# Patient Record
Sex: Female | Born: 1990
Health system: Southern US, Community
[De-identification: ages and names within clinical notes are randomized; demographics above are authoritative.]

## PROBLEM LIST (undated history)

## (undated) ENCOUNTER — Emergency Department (HOSPITAL_COMMUNITY): Admission: EM | Payer: Self-pay | Source: Home / Self Care

## (undated) DIAGNOSIS — O009 Unspecified ectopic pregnancy without intrauterine pregnancy: Secondary | ICD-10-CM

## (undated) DIAGNOSIS — H469 Unspecified optic neuritis: Secondary | ICD-10-CM

## (undated) HISTORY — DX: Unspecified optic neuritis: H46.9

## (undated) HISTORY — PX: NO PAST SURGERIES: SHX2092

---
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
## 2009-09-17 ENCOUNTER — Emergency Department (HOSPITAL_COMMUNITY): Admission: EM | Admit: 2009-09-17 | Discharge: 2009-09-17 | Payer: Self-pay | Admitting: Emergency Medicine

## 2010-04-05 ENCOUNTER — Emergency Department (HOSPITAL_COMMUNITY)
Admission: EM | Admit: 2010-04-05 | Discharge: 2010-04-06 | Payer: Self-pay | Source: Home / Self Care | Admitting: Emergency Medicine

## 2010-06-29 LAB — BASIC METABOLIC PANEL
BUN: 16 mg/dL (ref 6–23)
CO2: 26 mEq/L (ref 19–32)
Calcium: 9.3 mg/dL (ref 8.4–10.5)
Chloride: 105 mEq/L (ref 96–112)
Creatinine, Ser: 0.81 mg/dL (ref 0.4–1.2)
GFR calc Af Amer: >60 mL/min (ref >60)
GFR calc non Af Amer: >60 mL/min (ref >60)
Glucose, Bld: 89 mg/dL (ref 70–99)
Potassium: 4 mEq/L (ref 3.5–5.1)
Sodium: 139 mEq/L (ref 135–145)

## 2010-07-06 LAB — POCT PREGNANCY, URINE: Preg Test, Ur: NEGATIVE

## 2010-07-06 LAB — DIFFERENTIAL
Basophils Absolute: 0 10*3/uL (ref 0.0–0.1)
Basophils Relative: 1 % (ref 0–1)
Eosinophils Absolute: 0.1 10*3/uL (ref 0.0–0.7)
Eosinophils Relative: 2 % (ref 0–5)
Lymphocytes Relative: 43 % (ref 12–46)
Lymphs Abs: 1.7 10*3/uL (ref 0.7–4.0)
Monocytes Absolute: 0.3 10*3/uL (ref 0.1–1.0)
Monocytes Relative: 7 % (ref 3–12)
Neutro Abs: 2 10*3/uL (ref 1.7–7.7)
Neutrophils Relative %: 48 % (ref 43–77)

## 2010-07-06 LAB — BASIC METABOLIC PANEL
BUN: 9 mg/dL (ref 6–23)
CO2: 28 mEq/L (ref 19–32)
Calcium: 9.6 mg/dL (ref 8.4–10.5)
Chloride: 109 mEq/L (ref 96–112)
Creatinine, Ser: 0.75 mg/dL (ref 0.4–1.2)
GFR calc Af Amer: >60 mL/min (ref >60)
GFR calc non Af Amer: >60 mL/min (ref >60)
Glucose, Bld: 82 mg/dL (ref 70–99)
Potassium: 4 mEq/L (ref 3.5–5.1)
Sodium: 140 mEq/L (ref 135–145)

## 2010-07-06 LAB — CBC
HCT: 41 % (ref 36.0–46.0)
Hemoglobin: 13.5 g/dL (ref 12.0–15.0)
MCHC: 32.9 g/dL (ref 30.0–36.0)
MCV: 85.5 fL (ref 78.0–100.0)
Platelets: 118 10*3/uL — ABNORMAL LOW (ref 150–400)
RBC: 4.79 MIL/uL (ref 3.87–5.11)
RDW: 12.5 % (ref 11.5–15.5)
WBC: 4.1 10*3/uL (ref 4.0–10.5)

## 2010-08-04 LAB — RAPID STREP SCREEN (MED CTR MEBANE ONLY): Streptococcus, Group A Screen (Direct): POSITIVE — AB

## 2010-09-01 NOTE — H&P (Signed)
Jennifer [name], Jennifer [name]                ACCOUNT NO.:  [id number]   MEDICAL RECORD NO.:  [id number]          PATIENT TYPE:  INP   LOCATION:  LDR                           FACILITY:  [facility]   PHYSICIAN:  Lazaro Arms, M.D.   DATE OF BIRTH:  06/16/1990   DATE OF ADMISSION:  DATE OF DISCHARGE:  LH                              HISTORY & PHYSICAL   CHIEF COMPLAINTS:  Induction of labor, secondary to post dates on  Tuesday, September 19, 2006.   HISTORY OF PRESENT ILLNESS:  Saddie is a 20 year old gravida 1, para 0  with an EDC of Sep 10, 2006, based on ultrasound-assigned [facility], placing  her at 41-1/[redacted] weeks gestation.  She began prenatal care in her first  trimester and has had regular visits since then.  Her prenatal course  has been complicated by several bouts of Chlamydia versus Chlamydia that  just never went away.  She has had two negative proof of cures,  since  then.  Blood pressures have been 110s to 120s over 50s to 80s.  Total  weight gain has been about 30 pounds with appropriate fundal height  growth.   PAST MEDICAL HISTORY:  Noncontributory.   SURGICAL HISTORY:  Positive for T&A.   ALLERGIES:  NO ALLERGIES.   MEDICATIONS:  None.   SOCIAL HISTORY:  She is a Medical sales representative at [employer], lives  with her mom and dad.  Father of the baby is 1 years old.   FAMILY HISTORY:  Positive for hypertension, stroke, CAD and cancer.   PHYSICAL EXAM:  HEENT:  Within normal limits.  HEART:  Regular rate and rhythm.  LUNGS:  Clear.  ABDOMEN:  Soft and nontender.  Estimated fetal weight about 6-1/2  pounds, cervical exam 3-4 cm, 50% effaced, -1 station.  LEGS:  Negative.   IMPRESSION:  Intrauterine pregnancy at 41-1/2 weeks, medical induction  of labor.   PLAN:  Will admit her on Tuesday morning and plan AROM and Pitocin.      Jacklyn Shell, C.N.M.      Lazaro Arms, M.D.  Electronically Signed    FC/MEDQ  D:  09/15/2006  T:  09/15/2006  Job:  [id number]

## 2010-09-01 NOTE — Op Note (Signed)
Jennifer [name], Jennifer [name]                ACCOUNT NO.:  [id number]   MEDICAL RECORD NO.:  [id number]          PATIENT TYPE:  INP   LOCATION:  LDR4                          FACILITY:  [facility]   PHYSICIAN:  Lazaro Arms, M.D.   DATE OF BIRTH:  07/06/1990   DATE OF PROCEDURE:  09/20/2006  DATE OF DISCHARGE:                               OPERATIVE REPORT   INDICATIONS FOR PROCEDURE:  Jennifer [name] is 20 year old African-American  female, gravida 1, para 0 at 58 and a half weeks' gestation who is  admitted for post-dates induction.  She is doing well, had one dose of  IV pain medicine.  She is 5 cm and is requesting an epidural.   PROCEDURE IN DETAIL:  Patient is placed in a sitting position.  Betadine  prep as usual and 1% lidocaine is injected in the L3-L4 interspace.  A  17-gauge Tuohy needle is used, loss resistance technique employed and  epidural space is found without difficulty with one pass.  About 3 mL of  0.125% bupivacaine is given as a test dose.  The epidural catheter was  then fed.  An additional 7 mL was then given.  It is taken down 5 cm in  the epidural space.  After she laid down, I gave her another 10 mL to  dose up the epidural.  The continuous infusion of 0.125% bupivacaine  with 2 mcg/mL of fentanyl was begun at 12 mL an hour.  The patient  tolerated the procedure well.  She is getting good pain relief.  Fetal  heart rate tracing was stable.  Blood pressure was stable.      Lazaro Arms, M.D.  Electronically Signed     LHE/MEDQ  D:  09/20/2006  T:  09/20/2006  Job:  [id number]

## 2010-09-01 NOTE — Group Therapy Note (Signed)
NAMEKEAJAH, KILLOUGH                ACCOUNT NO.:  [id number]   MEDICAL RECORD NO.:  [id number]          PATIENT TYPE:  INP   LOCATION:  A413                          FACILITY:  [facility]   PHYSICIAN:  Lazaro Arms, M.D.   DATE OF BIRTH:  13-Jun-1990   DATE OF PROCEDURE:  09/20/2006  DATE OF DISCHARGE:                                 PROGRESS NOTE   DELIVERY NOTE:  Jennifer [name] was comfortable after her epidural and progressed  nicely through labor.  She was noted to be completely dilated at  approximately 1645.  She pushed effectively and after a little over an  hour she had a spontaneous vaginal delivery of a viable female infant at  58.  The right hand was compound.  It was delivered without difficulty  and then the rest of the body came without difficulty.  Just prior to  delivery, a midline episiotomy was cut.  Weight of the baby is 6 pounds,  15-1/2 ounces.  Apgars are 9 and 9.  Twenty units of Pitocin diluted in  1000 cc of lactated Ringer's was infused rapidly IV.  The placenta  separated spontaneously and delivered via controlled cord traction at  1755.  It was inspected and appears to be intact with a three-vessel  cord.  The vagina was inspected and no extensions of the last of the  episiotomy were noted.  There were 2 bleeders on the left side of the  episiotomy that were tied off using 3-0 Monocryl suture and the rest of  the episiotomy repair was made with a 2-0 Vicryl.  Estimated blood loss  350 cc.  The epidural catheter was removed with the blue tip visualized  as being intact.      Jacklyn Shell, C.N.M.      Lazaro Arms, M.D.  Electronically Signed    FC/MEDQ  D:  09/20/2006  T:  09/21/2006  Job:  [id number]   cc:   Jeoffrey Massed, MD  Fax: [phone]

## 2010-09-04 NOTE — H&P (Signed)
[name], [name]                ACCOUNT NO.:  [id number]   MEDICAL RECORD NO.:  [id number]          PATIENT TYPE:  OIB   LOCATION:  LDR3                          FACILITY:  [facility]   PHYSICIAN:  Tilda Burrow, M.D. DATE OF BIRTH:  [birth year]   DATE OF ADMISSION:  07/11/2006  DATE OF DISCHARGE:  LH                              HISTORY & PHYSICAL   REASON FOR ADMISSION:  Pregnancy at 31 weeks with a yellowish discharge.  The patient thinks it might be a mucus plug.   MEDICAL HISTORY:  Negative.   SURGICAL HISTORY:  Positive for tonsils and adenoids.   FAMILY HISTORY:  Positive for hypertension, stroke, coronary artery  disease, and cancer.   PRENATAL COURSE:  Uneventful with the exception she has had Chlamydia x3  during the course of this pregnancy. She has been repeatedly treated but  reinfected.  Blood type is O positive.  UDS negative.  Rubella immune.  Hepatitis B surface antigen is negative.  HIV is nonreactive.  __rpr___  nonreactive.  GC and Chlamydia were positive.  The patient was treated  and on repeat as positive again.  Sickle cell screen was negative.   PHYSICAL EXAMINATION:  VITAL SIGNS:  Stable.  Fetal heart rate pattern  is stable.  GENERAL:  There are no uterine contractions noted.  PELVIC:  Sterile speculum exam was performed.  Cervix is closed, long,  and thick.   A fetal fibronectin was obtained.  Urine for GC and Chlamydia was  obtained.  There is a copious yellow discharge present. The patient  denies any itching or burning with the discharge.   PLAN:  A repeat GC and Chlamydia, fetal fibronectin.  She is to follow  up in the office Friday or p.r.n.  At that time we will re-evaluate at  that time.      Jennifer [name], Jennifer [name]      Tilda Burrow, M.D.  Electronically Signed    DL/MEDQ  D:  16/01/9603  T:  07/11/2006  Job:  [id number]

## 2011-02-04 LAB — CBC
HCT: 21.9 — ABNORMAL LOW
Hemoglobin: 7.5 — CL
MCHC: 34.3
MCV: 80 — ABNORMAL LOW
Platelets: 119 — ABNORMAL LOW
RBC: 2.73 — ABNORMAL LOW
RDW: 13.7
WBC: 15.9 — ABNORMAL HIGH

## 2011-02-04 LAB — CORD BLOOD GAS (ARTERIAL)
Acid-base deficit: 5.7 — ABNORMAL HIGH
Bicarbonate: 18.7 — ABNORMAL LOW
TCO2: 19.7
pCO2 cord blood (arterial): 35
pH cord blood (arterial): 7.35
pO2 cord blood: 34

## 2011-02-04 LAB — DIFFERENTIAL
Basophils Absolute: 0.1
Basophils Relative: 0
Eosinophils Absolute: 0.1
Eosinophils Relative: 1
Lymphocytes Relative: 11 — ABNORMAL LOW
Lymphs Abs: 1.7
Monocytes Absolute: 2 — ABNORMAL HIGH
Monocytes Relative: 13 — ABNORMAL HIGH
Neutro Abs: 12 — ABNORMAL HIGH
Neutrophils Relative %: 75 — ABNORMAL HIGH

## 2011-02-04 LAB — ABO/RH: ABO/RH(D): B POS

## 2012-08-04 ENCOUNTER — Emergency Department (HOSPITAL_COMMUNITY): Payer: Medicaid Other

## 2012-08-04 ENCOUNTER — Inpatient Hospital Stay (HOSPITAL_COMMUNITY)
Admission: AD | Admit: 2012-08-04 | Payer: Medicaid Other | Source: Ambulatory Visit | Admitting: Obstetrics and Gynecology

## 2012-08-04 ENCOUNTER — Encounter (HOSPITAL_COMMUNITY): Payer: Self-pay | Admitting: *Deleted

## 2012-08-04 ENCOUNTER — Emergency Department (HOSPITAL_COMMUNITY)
Admission: EM | Admit: 2012-08-04 | Discharge: 2012-08-04 | Disposition: A | Discharge status: Other Acute Inpatient Hospital | Payer: Medicaid Other | Attending: Emergency Medicine | Admitting: Emergency Medicine

## 2012-08-04 DIAGNOSIS — O99330 Smoking (tobacco) complicating pregnancy, unspecified trimester: Secondary | ICD-10-CM | POA: Insufficient documentation

## 2012-08-04 DIAGNOSIS — O208 Other hemorrhage in early pregnancy: Secondary | ICD-10-CM

## 2012-08-04 DIAGNOSIS — R109 Unspecified abdominal pain: Secondary | ICD-10-CM | POA: Insufficient documentation

## 2012-08-04 DIAGNOSIS — O26899 Other specified pregnancy related conditions, unspecified trimester: Secondary | ICD-10-CM

## 2012-08-04 DIAGNOSIS — O9989 Other specified diseases and conditions complicating pregnancy, childbirth and the puerperium: Secondary | ICD-10-CM | POA: Insufficient documentation

## 2012-08-04 DIAGNOSIS — O209 Hemorrhage in early pregnancy, unspecified: Secondary | ICD-10-CM

## 2012-08-04 DIAGNOSIS — N949 Unspecified condition associated with female genital organs and menstrual cycle: Secondary | ICD-10-CM | POA: Insufficient documentation

## 2012-08-04 HISTORY — DX: Unspecified ectopic pregnancy without intrauterine pregnancy: O00.90

## 2012-08-04 LAB — CREATININE, SERUM
Creatinine, Ser: 0.66 mg/dL (ref 0.50–1.10)
GFR calc Af Amer: >90 mL/min (ref >90)
GFR calc non Af Amer: >90 mL/min (ref >90)

## 2012-08-04 LAB — URINALYSIS, ROUTINE W REFLEX MICROSCOPIC
Bilirubin Urine: NEGATIVE
Glucose, UA: NEGATIVE mg/dL
Hgb urine dipstick: NEGATIVE
Ketones, ur: NEGATIVE mg/dL
Leukocytes, UA: NEGATIVE
Nitrite: NEGATIVE
Protein, ur: NEGATIVE mg/dL
Specific Gravity, Urine: >1.03 — ABNORMAL HIGH (ref 1.005–1.030)
Urobilinogen, UA: 0.2 mg/dL (ref 0.0–1.0)
pH: 6 (ref 5.0–8.0)

## 2012-08-04 LAB — CBC
HCT: 38.4 % (ref 36.0–46.0)
Hemoglobin: 12.9 g/dL (ref 12.0–15.0)
MCH: 27.8 pg (ref 26.0–34.0)
MCHC: 33.6 g/dL (ref 30.0–36.0)
MCV: 82.8 fL (ref 78.0–100.0)
Platelets: 143 10*3/uL — ABNORMAL LOW (ref 150–400)
RBC: 4.64 MIL/uL (ref 3.87–5.11)
RDW: 13.6 % (ref 11.5–15.5)
WBC: 5.7 10*3/uL (ref 4.0–10.5)

## 2012-08-04 LAB — BUN: BUN: 8 mg/dL (ref 6–23)

## 2012-08-04 LAB — WET PREP, GENITAL
Trich, Wet Prep: NONE SEEN
WBC, Wet Prep HPF POC: NONE SEEN
Yeast Wet Prep HPF POC: NONE SEEN

## 2012-08-04 LAB — ABO/RH: ABO/RH(D): B POS

## 2012-08-04 LAB — AST: AST: 13 U/L (ref 0–37)

## 2012-08-04 LAB — HCG, QUANTITATIVE, PREGNANCY: hCG, Beta Chain, Quant, S: 1418 m[IU]/mL — ABNORMAL HIGH (ref <5)

## 2012-08-04 MED ORDER — METHOTREXATE INJECTION FOR WOMEN'S HOSPITAL
50.0000 mg/m2 | Freq: Once | INTRAMUSCULAR | Status: AC
  Administered 2012-08-04: 75 mg via INTRAMUSCULAR
  Filled 2012-08-04: qty 1.5

## 2012-08-04 NOTE — MAU Note (Signed)
Pt transferred from [facility] with dx'd ectopic.  Pt went to [facility] because of bleeding, also had abd pain that had been an ongoing issue.

## 2012-08-04 NOTE — ED Notes (Signed)
Ultrasound department is staffed and aware of pending patient ultrasound. Delay explained to MD and to patient.

## 2012-08-04 NOTE — MAU Note (Signed)
Ectopic pregnancy/MTX handout given, and discussed

## 2012-08-04 NOTE — MAU Provider Note (Signed)
History     CSN: [id number]  Arrival date and time: 08/04/12 1191   None     Chief Complaint  Patient presents with  . Pelvic Pain  . Vaginal Bleeding   HPI 22 y.o. G2P1000 at [redacted]w[redacted]d by LMP sent from [facility] ED where she was evaluated for low abd pain and vaginal bleeding this morning. Quant HCG was 1400 and u/s showed 1.9 x 1.9 x 3.6 cm mass in left adnexa highly suspicious but not yet definitive for ectopic pregnancy. This is a desired pregnancy. Pt states she is having very little pain at this time, but rated her pain at a 6 this morning. Bleeding continues.   Past Medical History  Diagnosis Date  . Ectopic pregnancy     MTX 07/2012    Past Surgical History  Procedure Laterality Date  . No past surgeries      History reviewed. No pertinent family history.  History  Substance Use Topics  . Smoking status: Current Every Day Smoker -- 0.50 packs/day    Types: Cigarettes  . Smokeless tobacco: Never Used  . Alcohol Use: No    Allergies: No Known Allergies  No prescriptions prior to admission    Review of Systems  Constitutional: Negative.   Respiratory: Negative.   Cardiovascular: Negative.   Gastrointestinal: Positive for abdominal pain. Negative for nausea, vomiting, diarrhea and constipation.  Genitourinary: Negative for dysuria, urgency, frequency, hematuria and flank pain.       + vaginal bleeding  Musculoskeletal: Negative.   Neurological: Negative.   Psychiatric/Behavioral: Negative.    Physical Exam   Blood pressure 108/64, pulse 57, temperature 97.2 F (36.2 C), temperature source Oral, resp. rate 20, height 5\' 1"  (1.549 m), weight 117 lb (53.071 kg), last menstrual period 06/22/2012, SpO2 100.00%.  Physical Exam  Nursing note and vitals reviewed. Constitutional: She is oriented to person, place, and time. She appears well-developed and well-nourished. No distress.  Cardiovascular: Normal rate.   Respiratory: Effort normal.  GI: Soft. She  exhibits no distension and no mass. There is tenderness (mild, mid to left low abd). There is no rebound and no guarding.  Musculoskeletal: Normal range of motion.  Neurological: She is alert and oriented to person, place, and time.  Skin: Skin is warm and dry.  Psychiatric: She has a normal mood and affect.    MAU Course  Procedures  Results for orders placed during the hospital encounter of 08/04/12 (from the past 24 hour(s))  CBC     Status: Abnormal   Collection Time    08/04/12  7:40 AM      Result Value Range   WBC 5.7  4.0 - 10.5 K/uL   RBC 4.64  3.87 - 5.11 MIL/uL   Hemoglobin 12.9  12.0 - 15.0 g/dL   HCT 47.8  29.5 - 62.1 %   MCV 82.8  78.0 - 100.0 fL   MCH 27.8  26.0 - 34.0 pg   MCHC 33.6  30.0 - 36.0 g/dL   RDW 30.8  65.7 - 84.6 %   Platelets 143 (*) 150 - 400 K/uL  ABO/RH     Status: None   Collection Time    08/04/12  7:40 AM      Result Value Range   ABO/RH(D) B POS    HCG, QUANTITATIVE, PREGNANCY     Status: Abnormal   Collection Time    08/04/12  7:40 AM      Result Value Range   hCG, Beta  Chain, Quant, S 1418 (*) <5 mIU/mL  URINALYSIS, ROUTINE W REFLEX MICROSCOPIC     Status: Abnormal   Collection Time    08/04/12  7:50 AM      Result Value Range   Color, Urine YELLOW  YELLOW   APPearance CLEAR  CLEAR   Specific Gravity, Urine >1.030 (*) 1.005 - 1.030   pH 6.0  5.0 - 8.0   Glucose, UA NEGATIVE  NEGATIVE mg/dL   Hgb urine dipstick NEGATIVE  NEGATIVE   Bilirubin Urine NEGATIVE  NEGATIVE   Ketones, ur NEGATIVE  NEGATIVE mg/dL   Protein, ur NEGATIVE  NEGATIVE mg/dL   Urobilinogen, UA 0.2  0.0 - 1.0 mg/dL   Nitrite NEGATIVE  NEGATIVE   Leukocytes, UA NEGATIVE  NEGATIVE  WET PREP, GENITAL     Status: Abnormal   Collection Time    08/04/12  8:34 AM      Result Value Range   Yeast Wet Prep HPF POC NONE SEEN  NONE SEEN   Trich, Wet Prep NONE SEEN  NONE SEEN   Clue Cells Wet Prep HPF POC RARE (*) NONE SEEN   WBC, Wet Prep HPF POC NONE SEEN  NONE SEEN    BUN     Status: None   Collection Time    08/04/12  4:40 PM      Result Value Range   BUN 8  6 - 23 mg/dL  CREATININE, SERUM     Status: None   Collection Time    08/04/12  4:40 PM      Result Value Range   Creatinine, Ser 0.66  0.50 - 1.10 mg/dL   GFR calc non Af Amer >90  >90 mL/min   GFR calc Af Amer >90  >90 mL/min  AST     Status: None   Collection Time    08/04/12  4:40 PM      Result Value Range   AST 13  0 - 37 U/L    Assessment and Plan  22 y.o. G2P1000 at [redacted]w[redacted]d with left ectopic pregnancy - will treat with methotrexate per protocol today F/U on day 4 for repeat quant Precautions rev'd  Zhane Donlan 08/04/2012, 5:37 PM

## 2012-08-04 NOTE — ED Notes (Signed)
Patient 2 weeks preganant woke this AM w/severe abdominal cramping.  Went to [facility] noticed dark red blood from vagina.

## 2012-08-04 NOTE — ED Provider Notes (Signed)
History     CSN: [id number]  Arrival date & time 08/04/12  4098   First MD Initiated Contact with Patient 08/04/12 [phone]      Chief Complaint  Patient presents with  . Pelvic Pain  . Vaginal Bleeding    HPI Pt was seen at 0715.   Per pt, c/o gradual onset and persistence of constant vaginal bleeding that began this morning PTA. Describes the bleeding as "more than spotting." Has been associated with lower abd "cramping" pain. Pt hx G2P1, LMP 06/22/2012 with EGA [redacted] weeks and 1/7 days.  Denies dysuria/hematuria, no N/V/D, no back pain, no fevers, no CP/SOB.    OB/GYN: [facility] History reviewed. No pertinent past medical history.  History reviewed. No pertinent past surgical history.    History  Substance Use Topics  . Smoking status: Current Every Day Smoker -- 0.50 packs/day    Types: Cigarettes  . Smokeless tobacco: Not on file  . Alcohol Use: No    OB History   Grav Para Term Preterm Abortions TAB SAB Ect Mult Living   2 1 1              Review of Systems ROS: Statement: All systems negative except as marked or noted in the HPI; Constitutional: Negative for fever and chills. ; ; Eyes: Negative for eye pain, redness and discharge. ; ; ENMT: Negative for ear pain, hoarseness, nasal congestion, sinus pressure and sore throat. ; ; Cardiovascular: Negative for chest pain, palpitations, diaphoresis, dyspnea and peripheral edema. ; ; Respiratory: Negative for cough, wheezing and stridor. ; ; Gastrointestinal: Negative for nausea, vomiting, diarrhea, abdominal pain, blood in stool, hematemesis, jaundice and rectal bleeding. . ; ; Genitourinary: Negative for dysuria, flank pain and hematuria. ; ; GYN: +pelvic pain, +vaginal bleeding. no vaginal discharge, no vulvar pain.;; Musculoskeletal: Negative for back pain and neck pain. Negative for swelling and trauma.; ; Skin: Negative for pruritus, rash, abrasions, blisters, bruising and skin lesion.; ; Neuro: Negative for headache,  lightheadedness and neck stiffness. Negative for weakness, altered level of consciousness , altered mental status, extremity weakness, paresthesias, involuntary movement, seizure and syncope.       Allergies  Review of patient's allergies indicates no known allergies.  Home Medications  No current outpatient prescriptions on file.  BP 108/75  Pulse 71  Temp(Src) 97.9 F (36.6 C) (Oral)  Ht 5\' 1"  (1.549 m)  Wt 117 lb (53.071 kg)  BMI 22.12 kg/m2  SpO2 100%  LMP 06/22/2012  Physical Exam 0720: Physical examination:  Nursing notes reviewed; Vital signs and O2 SAT reviewed;  Constitutional: Well developed, Well nourished, Well hydrated, In no acute distress; Head:  Normocephalic, atraumatic; Eyes: EOMI, PERRL, No scleral icterus; ENMT: Mouth and pharynx normal, Mucous membranes moist; Neck: Supple, Full range of motion, No lymphadenopathy; Cardiovascular: Regular rate and rhythm, No murmur, rub, or gallop; Respiratory: Breath sounds clear & equal bilaterally, No rales, rhonchi, wheezes.  Speaking full sentences with ease, Normal respiratory effort/excursion; Chest: Nontender, Movement normal; Abdomen: Soft, +mild suprapubic tenderness to palp. No rebound or guarding. Nondistended, Normal bowel sounds; Genitourinary: No CVA tenderness. Pelvic exam performed with permission of pt and female ED RN assist during exam.  External genitalia w/o lesions. Vaginal vault without discharge, +dark blood with clots in vault.  Cervix w/o lesions, not friable, small amount of bleeding from closed os.  GC/chlam and wet prep obtained and sent to lab.  Bimanual exam w/o CMT or adnexal tenderness. +mild suprapubic tenderness.;;; Extremities: Pulses normal,  No tenderness, No edema, No calf edema or asymmetry.; Neuro: AA&Ox3, Major CN grossly intact.  Speech clear. No gross focal motor or sensory deficits in extremities.; Skin: Color normal, Warm, Dry.   ED Course  Procedures    MDM  MDM Reviewed: previous  chart, nursing note and vitals Interpretation: labs and ultrasound     Results for orders placed during the hospital encounter of 08/04/12  WET PREP, GENITAL      Result Value Range   Yeast Wet Prep HPF POC NONE SEEN  NONE SEEN   Trich, Wet Prep NONE SEEN  NONE SEEN   Clue Cells Wet Prep HPF POC RARE (*) NONE SEEN   WBC, Wet Prep HPF POC NONE SEEN  NONE SEEN  URINALYSIS, ROUTINE W REFLEX MICROSCOPIC      Result Value Range   Color, Urine YELLOW  YELLOW   APPearance CLEAR  CLEAR   Specific Gravity, Urine >1.030 (*) 1.005 - 1.030   pH 6.0  5.0 - 8.0   Glucose, UA NEGATIVE  NEGATIVE mg/dL   Hgb urine dipstick NEGATIVE  NEGATIVE   Bilirubin Urine NEGATIVE  NEGATIVE   Ketones, ur NEGATIVE  NEGATIVE mg/dL   Protein, ur NEGATIVE  NEGATIVE mg/dL   Urobilinogen, UA 0.2  0.0 - 1.0 mg/dL   Nitrite NEGATIVE  NEGATIVE   Leukocytes, UA NEGATIVE  NEGATIVE  CBC      Result Value Range   WBC 5.7  4.0 - 10.5 K/uL   RBC 4.64  3.87 - 5.11 MIL/uL   Hemoglobin 12.9  12.0 - 15.0 g/dL   HCT 52.8  41.3 - 24.4 %   MCV 82.8  78.0 - 100.0 fL   MCH 27.8  26.0 - 34.0 pg   MCHC 33.6  30.0 - 36.0 g/dL   RDW 01.0  27.2 - 53.6 %   Platelets 143 (*) 150 - 400 K/uL  HCG, QUANTITATIVE, PREGNANCY      Result Value Range   hCG, Beta Chain, Quant, S 1418 (*) <5 mIU/mL  ABO/RH      Result Value Range   ABO/RH(D) B POS      US Ob Comp Less 14 Wks 08/04/2012  *RADIOLOGY REPORT*  Clinical Data: Vaginal bleeding.  Rule out ectopic gestation. bHCG =1418  OBSTETRIC <14 WK [place] AND TRANSVAGINAL OB US  Technique:  Both transabdominal and transvaginal ultrasound examinations were performed for complete evaluation of the gestation as well as the maternal uterus, adnexal regions, and pelvic cul-de-sac.  Transvaginal technique was performed to assess early pregnancy.  Comparison:  None.  Intrauterine gestational sac:  None seen Yolk sac: Not applicable Embryo: Not applicable Cardiac Activity: Not applicable  A thin  endometrial lining is identified with no signs of an intrauterine gestational sac measuring 2.7 mm in width.  Maternal uterus/adnexae: In the left adnexa contiguous with the left ovary is a complex soft tissue density slightly echogenic measuring 1.9 x 1.9 x 3.6 cm and positioned superior and medial to the left ovary.  On cine loop views (especially sagittal views) this appears to potentially be separate from the left ovary but on transverse images a beaked rim of ovarian tissue may be present signifying an ovarian lesion. This demonstrates a small amount of circumferential flow but is avascular centrally.  Normal ovarian stroma is seen measuring 2.4 x 1.8 by 2.2 cm and containing a corpus luteum.  This appearance is suspicious for an ectopic gestation with an exophytic dermoid a secondary consideration. The performing sonographic  felt this was a part of the ovary.  The right ovary has a normal appearance measuring 1.3 by 2.6 x 1.4 cm  A small amount of simple free fluid is noted adjacent to the left ovary and extending into the cul-de-sac.  IMPRESSION: Thin endometrial lining with no evidence for intrauterine gestation.  Left adnexal soft tissue mass contiguous with but likely separate from the left ovary on cine loop views as described above is suspicious for a left ectopic gestation. This cannot be absolutely separated from the left ovary and an exophytic dermoid could have a similar appearance. Correlation with serial bHCG with rescanning as indicated is recommended.  Normal right ovary.  This report was discussed with Dr. Clarene Duke at 11:30 on 08/04/2012.   Original Report Authenticated By: Rhodia Albright, M.D.     1150:  Pt's VS remain stable.  Appears NAD, walking around ED exam room, resps easy, making phone calls. Dx and testing d/w pt.  Questions answered.  Verb understanding, agreeable to transfer to [facility] for further eval.  T/C to OB/GYN Dr. Shawnie Pons, case discussed, including:  HPI, pertinent  PM/SHx, VS/PE, dx testing, ED course and treatment:  Agreeable to accept transfer, requests to send pt to MAU.         Laray Anger, DO 08/05/12 [phone]

## 2012-08-04 NOTE — MAU Note (Signed)
Pt waiting on blood work to be drawn. Sprite given,.

## 2012-08-04 NOTE — ED Notes (Signed)
Pt reports that she is [redacted] weeks pregnant and noticed this morning that she had bright red blood coming from her vagina. Pt states that the blood did not soak through a pad but was more than spotting, "a little bit heavier than a normal period."

## 2012-08-04 NOTE — MAU Provider Note (Signed)
Chart reviewed and agree with management and plan.  

## 2012-08-05 LAB — GC/CHLAMYDIA PROBE AMP
CT Probe RNA: NEGATIVE
GC Probe RNA: NEGATIVE

## 2012-08-06 LAB — URINE CULTURE
Colony Count: NO GROWTH
Culture: NO GROWTH

## 2012-08-07 ENCOUNTER — Encounter (HOSPITAL_COMMUNITY): Payer: Self-pay

## 2012-08-07 ENCOUNTER — Inpatient Hospital Stay (HOSPITAL_COMMUNITY)
Admission: AD | Admit: 2012-08-07 | Discharge: 2012-08-07 | Disposition: A | Discharge status: Home / Self Care | Payer: Medicaid Other | Source: Ambulatory Visit | Attending: Obstetrics and Gynecology | Admitting: Obstetrics and Gynecology

## 2012-08-07 DIAGNOSIS — O009 Unspecified ectopic pregnancy without intrauterine pregnancy: Secondary | ICD-10-CM

## 2012-08-07 DIAGNOSIS — O00109 Unspecified tubal pregnancy without intrauterine pregnancy: Secondary | ICD-10-CM | POA: Insufficient documentation

## 2012-08-07 LAB — HCG, QUANTITATIVE, PREGNANCY: hCG, Beta Chain, Quant, S: 429 m[IU]/mL — ABNORMAL HIGH (ref <5)

## 2012-08-07 NOTE — MAU Note (Signed)
Pt here for f/u post MTX on Friday, states intermittent cramping, spotting, no clots noted. Feels sore.

## 2012-08-07 NOTE — MAU Provider Note (Signed)
Attestation of Attending Supervision of Advanced Practitioner (CNM/NP): Evaluation and management procedures were performed by the Advanced Practitioner under my supervision and collaboration.  I have reviewed the Advanced Practitioner's note and chart, and I agree with the management and plan.  Madalee Altmann 08/07/2012 7:33 PM   

## 2012-08-07 NOTE — MAU Provider Note (Signed)
Ms. Jennifer [name] is a 22 y.o. G2P1010 at [redacted]w[redacted]d who presents to MAU today for follow-up quant hCG on day 4 s/p MTX for ectopic pregnancy. The patient states that she has had some intermittent cramping and spotting. No heavy bleeding. Quant hCG on Friday was 1418.   BP 108/65  Pulse 64  Temp(Src) 98 F (36.7 C) (Oral)  Resp 16  Ht 5\' 1"  (1.549 m)  Wt 119 lb 6 oz (54.148 kg)  BMI 22.57 kg/m2  LMP 06/22/2012  Breastfeeding? Unknown GENERAL: Well-developed, well-nourished female in no acute distress.  HEENT: Normocephalic, atraumatic.   LUNGS: Effort normal HEART: Regular rate  SKIN: Warm, dry and without erythema PSYCH: Normal mood and affect  Results for orders placed during the hospital encounter of 08/07/12 (from the past 24 hour(s))  HCG, QUANTITATIVE, PREGNANCY     Status: Abnormal   Collection Time    08/07/12  9:37 AM      Result Value Range   hCG, Beta Chain, Quant, S 429 (*) <5 mIU/mL   A: Ectopic pregnancy day #4 s/pMTX  P: Discharge home Bleeding precautions discussed Patient to return on Thursday for repeat labs Patient may return to MAU as needed sooner  Freddi Starr, PA-C 08/07/2012 10:28 AM

## 2013-04-25 ENCOUNTER — Encounter (HOSPITAL_COMMUNITY): Payer: Self-pay | Admitting: Emergency Medicine

## 2013-04-25 DIAGNOSIS — W01119A Fall on same level from slipping, tripping and stumbling with subsequent striking against unspecified sharp object, initial encounter: Secondary | ICD-10-CM | POA: Insufficient documentation

## 2013-04-25 DIAGNOSIS — Y9289 Other specified places as the place of occurrence of the external cause: Secondary | ICD-10-CM | POA: Insufficient documentation

## 2013-04-25 DIAGNOSIS — S41109A Unspecified open wound of unspecified upper arm, initial encounter: Secondary | ICD-10-CM | POA: Insufficient documentation

## 2013-04-25 DIAGNOSIS — S61209A Unspecified open wound of unspecified finger without damage to nail, initial encounter: Secondary | ICD-10-CM | POA: Insufficient documentation

## 2013-04-25 DIAGNOSIS — Y9389 Activity, other specified: Secondary | ICD-10-CM | POA: Insufficient documentation

## 2013-04-25 DIAGNOSIS — F17200 Nicotine dependence, unspecified, uncomplicated: Secondary | ICD-10-CM | POA: Insufficient documentation

## 2013-04-25 NOTE — ED Notes (Signed)
States she was at a party, got bumped and fell into a [employer] consultant shattered and she has a lace to left ring finger and left upper arm - bleeding controlled.  Other superficial cuts on arm.  Denies any other injury

## 2013-04-25 NOTE — ED Notes (Signed)
Admits to ETOH tonight

## 2013-04-26 ENCOUNTER — Emergency Department (HOSPITAL_COMMUNITY)
Admission: EM | Admit: 2013-04-26 | Discharge: 2013-04-26 | Disposition: A | Discharge status: Home / Self Care | Payer: Medicaid Other | Attending: Emergency Medicine | Admitting: Emergency Medicine

## 2013-04-26 DIAGNOSIS — S61215A Laceration without foreign body of left ring finger without damage to nail, initial encounter: Secondary | ICD-10-CM

## 2013-04-26 DIAGNOSIS — S41112A Laceration without foreign body of left upper arm, initial encounter: Secondary | ICD-10-CM

## 2013-04-26 MED ORDER — LIDOCAINE HCL (PF) 1 % IJ SOLN
10.0000 mL | Freq: Once | INTRAMUSCULAR | Status: DC
  Filled 2013-04-26: qty 10

## 2013-04-26 NOTE — ED Provider Notes (Signed)
CSN: [id number]     Arrival date & time 04/25/13  2234 History   First MD Initiated Contact with Patient 04/26/13 0051     Chief Complaint  Patient presents with  . Extremity Laceration    left ring finger and left upper arm   (Consider location/radiation/quality/duration/timing/severity/associated sxs/prior Treatment) The history is provided by the patient.   23 year old female fell against a mirror and it broke, causing laceration to her left upper arm and her left fourth finger. She denies foreign body sensation. His tetanus immunization was 8 years ago. She denies other injury. She does admit that she had been drinking earlier in the evening he admits to having had 3 or 4 shots.  Past Medical History  Diagnosis Date  . Ectopic pregnancy     MTX 07/2012   Past Surgical History  Procedure Laterality Date  . No past surgeries     No family history on file. History  Substance Use Topics  . Smoking status: Current Every Day Smoker -- 0.50 packs/day    Types: Cigarettes  . Smokeless tobacco: Never Used  . Alcohol Use: Yes   OB History   Grav Para Term Preterm Abortions TAB SAB Ect Mult Living   2 1 1  1   1        Review of Systems  All other systems reviewed and are negative.    Allergies  Review of patient's allergies indicates no known allergies.  Home Medications  No current outpatient prescriptions on file. BP 98/64  Pulse 96  Temp(Src) 98.7 F (37.1 C) (Oral)  Resp 24  Ht 5' (1.524 m)  Wt 125 lb (56.7 kg)  BMI 24.41 kg/m2  SpO2 100%  LMP 04/10/2013 Physical Exam  Nursing note and vitals reviewed.  23 year old female, resting comfortably and in no acute distress. Vital signs are significant for tachypnea with respiratory rate of 24. Oxygen saturation is 100 %, which is normal. Head is normocephalic and atraumatic. PERRLA, EOMI. Oropharynx is clear. Neck is nontender and supple without adenopathy or JVD. Back is nontender and there is no CVA  tenderness. Lungs are clear without rales, wheezes, or rhonchi. Chest is nontender. Heart has regular rate and rhythm without murmur. Abdomen is soft, flat, nontender without masses or hepatosplenomegaly and peristalsis is normoactive. Extremities have no cyanosis or edema, full range of motion is present. There is a laceration of the lateral aspect of the left upper arm and also a laceration across the flexor surface of the proximal phalanx of the left fourth finger. Distal neurovascular and tendon function is normal. Skin is warm and dry without rash. Neurologic: Mental status is normal, cranial nerves are intact, there are no motor or sensory deficits.  ED Course  Procedures (including critical care time) LACERATION REPAIR Performed by: WUJWJ,XBJYNGLICK,Trinita Devlin Authorized by: WGNFA,OZHYQGLICK,Sherly Brodbeck Consent: Verbal consent obtained. Risks and benefits: risks, benefits and alternatives were discussed Consent given by: patient Patient identity confirmed: provided demographic data Prepped and Draped in normal sterile fashion Wound explored  Laceration Location: left fourth finger   Laceration Length: 3.0 cm  No Foreign Bodies seen or palpated  Anesthesia: local infiltration  Local anesthetic: lidocaine 1% without epinephrine  Anesthetic total: 2.5 ml  Amount of cleaning: standard  Skin closure: close  Number of sutures: 6 5-0 nylon  Technique: simple interrupted, but with alignment of flap  Patient tolerance: Patient tolerated the procedure well with no immediate complications.  LACERATION REPAIR Performed by: MVHQI,ONGEXGLICK,Hila Bolding Authorized by: BMWUX,LKGMWGLICK,Anniah  Consent: Verbal consent  obtained. Risks and benefits: risks, benefits and alternatives were discussed Consent given by: patient Patient identity confirmed: provided demographic data Prepped and Draped in normal sterile fashion Wound explored  Laceration Location: left upper arm  Laceration Length: 2.0 cm  No Foreign Bodies seen or  palpated  Anesthesia: local infiltration  Local anesthetic: lidocaine 1% without epinephrine  Anesthetic total: 2.5 ml  Irrigation method: syringe Amount of cleaning: standard  Skin closure: close  Number of sutures: 4 4-0 nylon  Technique: simple interrupted   Patient tolerance: Patient tolerated the procedure well with no immediate complications.  MDM   1. Laceration of left upper arm without foreign body, initial encounter   2. Laceration of fourth finger, left, initial encounter    Lacerations of left arm and left fourth finger which will require suture repair.    Dione Booze, MD 04/26/13 [phone]

## 2013-04-26 NOTE — Discharge Instructions (Signed)
Stitches need to be removed in 7 days. This can be done at a physician's office, an urgent care center, or in the emergency department.  Laceration Care, Adult A laceration is a cut or lesion that goes through all layers of the skin and into the tissue just beneath the skin. TREATMENT  Some lacerations may not require closure. Some lacerations may not be able to be closed due to an increased risk of infection. It is important to see your caregiver as soon as possible after an injury to minimize the risk of infection and maximize the opportunity for successful closure. If closure is appropriate, pain medicines may be given, if needed. The wound will be cleaned to help prevent infection. Your caregiver will use stitches (sutures), staples, wound glue (adhesive), or skin adhesive strips to repair the laceration. These tools bring the skin edges together to allow for faster healing and a better cosmetic outcome. However, all wounds will heal with a scar. Once the wound has healed, scarring can be minimized by covering the wound with sunscreen during the day for 1 full year. HOME CARE INSTRUCTIONS  For sutures or staples:  Keep the wound clean and dry.  If you were given a bandage (dressing), you should change it at least once a day. Also, change the dressing if it becomes wet or dirty, or as directed by your caregiver.  Wash the wound with soap and water 2 times a day. Rinse the wound off with water to remove all soap. Pat the wound dry with a clean towel.  After cleaning, apply a thin layer of the antibiotic ointment as recommended by your caregiver. This will help prevent infection and keep the dressing from sticking.  You may shower as usual after the first 24 hours. Do not soak the wound in water until the sutures are removed.  Only take over-the-counter or prescription medicines for pain, discomfort, or fever as directed by your caregiver.  Get your sutures or staples removed as directed by  your caregiver. For skin adhesive strips:  Keep the wound clean and dry.  Do not get the skin adhesive strips wet. You may bathe carefully, using caution to keep the wound dry.  If the wound gets wet, pat it dry with a clean towel.  Skin adhesive strips will fall off on their own. You may trim the strips as the wound heals. Do not remove skin adhesive strips that are still stuck to the wound. They will fall off in time. For wound adhesive:  You may briefly wet your wound in the shower or bath. Do not soak or scrub the wound. Do not swim. Avoid periods of heavy perspiration until the skin adhesive has fallen off on its own. After showering or bathing, gently pat the wound dry with a clean towel.  Do not apply liquid medicine, cream medicine, or ointment medicine to your wound while the skin adhesive is in place. This may loosen the film before your wound is healed.  If a dressing is placed over the wound, be careful not to apply tape directly over the skin adhesive. This may cause the adhesive to be pulled off before the wound is healed.  Avoid prolonged exposure to sunlight or tanning lamps while the skin adhesive is in place. Exposure to ultraviolet light in the first year will darken the scar.  The skin adhesive will usually remain in place for 5 to 10 days, then naturally fall off the skin. Do not pick at the adhesive  film. You may need a tetanus shot if:  You cannot remember when you had your last tetanus shot.  You have never had a tetanus shot. If you get a tetanus shot, your arm may swell, get red, and feel warm to the touch. This is common and not a problem. If you need a tetanus shot and you choose not to have one, there is a rare chance of getting tetanus. Sickness from tetanus can be serious. SEEK MEDICAL CARE IF:   You have redness, swelling, or increasing pain in the wound.  You see a red line that goes away from the wound.  You have yellowish-white fluid (pus) coming  from the wound.  You have a fever.  You notice a bad smell coming from the wound or dressing.  Your wound breaks open before or after sutures have been removed.  You notice something coming out of the wound such as wood or glass.  Your wound is on your hand or foot and you cannot move a finger or toe. SEEK IMMEDIATE MEDICAL CARE IF:   Your pain is not controlled with prescribed medicine.  You have severe swelling around the wound causing pain and numbness or a change in color in your arm, hand, leg, or foot.  Your wound splits open and starts bleeding.  You have worsening numbness, weakness, or loss of function of any joint around or beyond the wound.  You develop painful lumps near the wound or on the skin anywhere on your body. MAKE SURE YOU:   Understand these instructions.  Will watch your condition.  Will get help right away if you are not doing well or get worse. Document Released: 04/05/2005 Document Revised: 06/28/2011 Document Reviewed: 09/29/2010 [facility] Patient Information 2014 [place], [place].

## 2013-09-01 ENCOUNTER — Emergency Department (HOSPITAL_COMMUNITY)
Admission: EM | Admit: 2013-09-01 | Discharge: 2013-09-01 | Disposition: A | Discharge status: Home / Self Care | Payer: BC Managed Care – PPO | Attending: Emergency Medicine | Admitting: Emergency Medicine

## 2013-09-01 ENCOUNTER — Encounter (HOSPITAL_COMMUNITY): Payer: Self-pay | Admitting: Emergency Medicine

## 2013-09-01 DIAGNOSIS — R110 Nausea: Secondary | ICD-10-CM | POA: Insufficient documentation

## 2013-09-01 DIAGNOSIS — N898 Other specified noninflammatory disorders of vagina: Secondary | ICD-10-CM | POA: Insufficient documentation

## 2013-09-01 DIAGNOSIS — M549 Dorsalgia, unspecified: Secondary | ICD-10-CM | POA: Insufficient documentation

## 2013-09-01 DIAGNOSIS — Z3202 Encounter for pregnancy test, result negative: Secondary | ICD-10-CM | POA: Insufficient documentation

## 2013-09-01 DIAGNOSIS — R6883 Chills (without fever): Secondary | ICD-10-CM | POA: Insufficient documentation

## 2013-09-01 DIAGNOSIS — F17200 Nicotine dependence, unspecified, uncomplicated: Secondary | ICD-10-CM | POA: Insufficient documentation

## 2013-09-01 DIAGNOSIS — Z8619 Personal history of other infectious and parasitic diseases: Secondary | ICD-10-CM | POA: Insufficient documentation

## 2013-09-01 DIAGNOSIS — N946 Dysmenorrhea, unspecified: Secondary | ICD-10-CM | POA: Insufficient documentation

## 2013-09-01 LAB — CBC WITH DIFFERENTIAL/PLATELET
Basophils Absolute: 0 10*3/uL (ref 0.0–0.1)
Basophils Relative: 0 % (ref 0–1)
Eosinophils Absolute: 0.1 10*3/uL (ref 0.0–0.7)
Eosinophils Relative: 2 % (ref 0–5)
HCT: 42 % (ref 36.0–46.0)
Hemoglobin: 14.1 g/dL (ref 12.0–15.0)
Lymphocytes Relative: 33 % (ref 12–46)
Lymphs Abs: 1.7 10*3/uL (ref 0.7–4.0)
MCH: 28.7 pg (ref 26.0–34.0)
MCHC: 33.6 g/dL (ref 30.0–36.0)
MCV: 85.5 fL (ref 78.0–100.0)
Monocytes Absolute: 0.4 10*3/uL (ref 0.1–1.0)
Monocytes Relative: 8 % (ref 3–12)
Neutro Abs: 3.1 10*3/uL (ref 1.7–7.7)
Neutrophils Relative %: 57 % (ref 43–77)
Platelets: 156 10*3/uL (ref 150–400)
RBC: 4.91 MIL/uL (ref 3.87–5.11)
RDW: 13.4 % (ref 11.5–15.5)
WBC: 5.4 10*3/uL (ref 4.0–10.5)

## 2013-09-01 LAB — WET PREP, GENITAL
Clue Cells Wet Prep HPF POC: NONE SEEN
Trich, Wet Prep: NONE SEEN
Yeast Wet Prep HPF POC: NONE SEEN

## 2013-09-01 LAB — POC URINE PREG, ED: Preg Test, Ur: NEGATIVE

## 2013-09-01 LAB — HCG, QUANTITATIVE, PREGNANCY: hCG, Beta Chain, Quant, S: <1 m[IU]/mL (ref <5)

## 2013-09-01 MED ORDER — NAPROXEN SODIUM 275 MG PO TABS: 275.0000 mg | ORAL_TABLET | Freq: Two times a day (BID) | ORAL | Status: DC

## 2013-09-01 MED ORDER — KETOROLAC TROMETHAMINE 60 MG/2ML IM SOLN
60.0000 mg | Freq: Once | INTRAMUSCULAR | Status: AC
  Administered 2013-09-01: 60 mg via INTRAMUSCULAR
  Filled 2013-09-01: qty 2

## 2013-09-01 NOTE — ED Provider Notes (Signed)
CSN: [id number]     Arrival date & time 09/01/13  0827 History   First MD Initiated Contact with Patient 09/01/13 [phone]     Chief Complaint  Patient presents with  . Abdominal Pain     (Consider location/radiation/quality/duration/timing/severity/associated sxs/prior Treatment) Patient is a 23 y.o. female presenting with abdominal pain. The history is provided by the patient.  Abdominal Pain Pain location:  LLQ and RLQ Pain quality: cramping   Pain radiates to:  Back Pain severity:  Moderate Onset quality:  Gradual Timing:  Constant Progression:  Worsening Chronicity:  New Relieved by:  Nothing Worsened by:  Nothing tried Ineffective treatments:  None tried Associated symptoms: chills, nausea, vaginal bleeding and vaginal discharge   Associated symptoms: no chest pain, no cough, no dysuria, no fever and no vomiting    Jennifer [name] is a G2P1010, LMP 08/01/2013 22 y.o.  who presents to the ED with lower abdominal pain that radiates to the back.the pain started early this morning. The pain woke her. It is time for her period but the cramping is worse that a period. She did start bleeding this am but the bleeding is very light. She complains of nausea. She does not use birth control. Last sexual intercourse 2 nights ago and was not painful. Current sex partner x 10 months. Hx of ectopic pregnancy one year ago and treated with MTX. Hx of Chlamydia.   Past Medical History  Diagnosis Date  . Ectopic pregnancy     MTX 07/2012   Past Surgical History  Procedure Laterality Date  . No past surgeries     History reviewed. No pertinent family history. History  Substance Use Topics  . Smoking status: Current Every Day Smoker -- 0.50 packs/day for 10 years    Types: Cigarettes  . Smokeless tobacco: Never Used  . Alcohol Use: 4.2 oz/week    4 Cans of beer, 3 Shots of liquor per week   OB History   Grav Para Term Preterm Abortions TAB SAB Ect Mult Living   2 1 1  1   1        Review  of Systems  Constitutional: Positive for chills. Negative for fever.  HENT: Negative.   Eyes: Negative for visual disturbance.  Respiratory: Negative for cough and wheezing.   Cardiovascular: Negative for chest pain and palpitations.  Gastrointestinal: Positive for nausea and abdominal pain. Negative for vomiting.  Genitourinary: Positive for vaginal bleeding and vaginal discharge. Negative for dysuria, urgency and frequency.  Musculoskeletal: Positive for back pain. Negative for myalgias.  Skin: Negative for rash.  Neurological: Negative for seizures, syncope and headaches.  Psychiatric/Behavioral: Negative for confusion. The patient is not nervous/anxious.       Allergies  Review of patient's allergies indicates no known allergies.  Home Medications   Prior to Admission medications   Not on File   BP 105/67  Pulse 56  Temp(Src) 98.2 F (36.8 C) (Oral)  Resp 20  Ht 5' (1.524 m)  Wt 130 lb (58.968 kg)  BMI 25.39 kg/m2  SpO2 100%  LMP 09/01/2013 Physical Exam  Nursing note and vitals reviewed. Constitutional: She is oriented to person, place, and time. She appears well-developed and well-nourished. No distress.  HENT:  Head: Normocephalic.  Eyes: EOM are normal.  Neck: Normal range of motion. Neck supple.  Cardiovascular: Normal rate and regular rhythm.   Pulmonary/Chest: Effort normal and breath sounds normal.  Abdominal: Soft. Bowel sounds are normal. There is tenderness.  Genitourinary:  External  genitalia without lesions. Small amount of blood vaginal vault. Cervix closed, no CMT, mild bilateral adnexal tenderness. Uterus without palpable enlargement.   Musculoskeletal: Normal range of motion.  Neurological: She is alert and oriented to person, place, and time. No cranial nerve deficit.  Skin: Skin is warm and dry.  Psychiatric: She has a normal mood and affect. Her behavior is normal.   Results for orders placed during the hospital encounter of 09/01/13 (from  the past 24 hour(s))  CBC WITH DIFFERENTIAL     Status: None   Collection Time    09/01/13  9:13 AM      Result Value Ref Range   WBC 5.4  4.0 - 10.5 K/uL   RBC 4.91  3.87 - 5.11 MIL/uL   Hemoglobin 14.1  12.0 - 15.0 g/dL   HCT 09.842.0  11.936.0 - 14.746.0 %   MCV 85.5  78.0 - 100.0 fL   MCH 28.7  26.0 - 34.0 pg   MCHC 33.6  30.0 - 36.0 g/dL   RDW 82.913.4  56.211.5 - 13.015.5 %   Platelets 156  150 - 400 K/uL   Neutrophils Relative % 57  43 - 77 %   Neutro Abs 3.1  1.7 - 7.7 K/uL   Lymphocytes Relative 33  12 - 46 %   Lymphs Abs 1.7  0.7 - 4.0 K/uL   Monocytes Relative 8  3 - 12 %   Monocytes Absolute 0.4  0.1 - 1.0 K/uL   Eosinophils Relative 2  0 - 5 %   Eosinophils Absolute 0.1  0.0 - 0.7 K/uL   Basophils Relative 0  0 - 1 %   Basophils Absolute 0.0  0.0 - 0.1 K/uL  HCG, QUANTITATIVE, PREGNANCY     Status: None   Collection Time    09/01/13  9:13 AM      Result Value Ref Range   hCG, Beta Chain, Quant, S <1  <5 mIU/mL  POC URINE PREG, ED     Status: None   Collection Time    09/01/13  9:24 AM      Result Value Ref Range   Preg Test, Ur NEGATIVE  NEGATIVE  WET PREP, GENITAL     Status: Abnormal   Collection Time    09/01/13 10:32 AM      Result Value Ref Range   Yeast Wet Prep HPF POC NONE SEEN  NONE SEEN   Trich, Wet Prep NONE SEEN  NONE SEEN   Clue Cells Wet Prep HPF POC NONE SEEN  NONE SEEN   WBC, Wet Prep HPF POC FEW (*) NONE SEEN    ED Course  Procedures  MDM  23 y.o. female with vaginal bleeding. Stable for discharge with negative Bhcg so no concern for ectopic pregnancy at this time. Exam does not give worry for torsion or TOA. Will treat for dysmenorrhea and patient will follow up with her PCP. She will return here for worsening symptoms. Will treat with NSAIDS.  Discussed with the patient and all questioned fully answered.    Medication List         naproxen sodium 275 MG tablet  Commonly known as:  ANAPROX  Take 1 tablet (275 mg total) by mouth 2 (two) times daily with a  meal.         Janne NapoleonHope M Neese, NP 09/01/13 1702

## 2013-09-01 NOTE — ED Notes (Signed)
Patient c/o lower abd pain that radiates into back. Patient reports vaginal bleeding and nausea. Denies any vomiting, diarrhea, or urinary symptoms.

## 2013-09-01 NOTE — Discharge Instructions (Signed)

## 2013-09-02 NOTE — ED Provider Notes (Signed)
Medical screening examination/treatment/procedure(s) were conducted as a shared visit with non-physician practitioner(s) and myself.  I personally evaluated the patient during the encounter.   EKG Interpretation None     History and physical most consistent with menstrual pain.  No acute abdomen.  Pregnancy test negative  Donnetta Hutching, MD 09/02/13 [phone]

## 2013-09-03 LAB — GC/CHLAMYDIA PROBE AMP
CT Probe RNA: NEGATIVE
GC Probe RNA: NEGATIVE

## 2014-02-18 ENCOUNTER — Encounter (HOSPITAL_COMMUNITY): Payer: Self-pay | Admitting: Emergency Medicine

## 2015-05-12 ENCOUNTER — Encounter: Payer: Self-pay | Admitting: Obstetrics & Gynecology

## 2015-05-12 ENCOUNTER — Ambulatory Visit (INDEPENDENT_AMBULATORY_CARE_PROVIDER_SITE_OTHER): Payer: BLUE CROSS/BLUE SHIELD | Admitting: Obstetrics & Gynecology

## 2015-05-12 VITALS — BP 100/60 | HR 60 | Wt 136.3 lb

## 2015-05-12 DIAGNOSIS — B9689 Other specified bacterial agents as the cause of diseases classified elsewhere: Secondary | ICD-10-CM

## 2015-05-12 DIAGNOSIS — N760 Acute vaginitis: Secondary | ICD-10-CM | POA: Diagnosis not present

## 2015-05-12 DIAGNOSIS — A499 Bacterial infection, unspecified: Secondary | ICD-10-CM | POA: Diagnosis not present

## 2015-05-12 MED ORDER — METRONIDAZOLE 0.75 % VA GEL: VAGINAL | Status: DC

## 2015-05-12 NOTE — Progress Notes (Signed)
Patient ID: Jennifer [name], female   DOB: Sep 02, 1990, 25 y.o.   MRN: [id number]      Chief Complaint  Patient presents with  . new gyn    weird discharge    Blood pressure 100/60, pulse 60, weight 136 lb 4.8 oz (61.825 kg), last menstrual period 04/18/2015.  25 y.o. G2P1010 Patient's last menstrual period was 04/18/2015. The current method of family planning is none.  Subjective Vaginal discharge for a couple of months Itchy Unpleasant odor occasionally  No antibiotics No bleeding Periods are regular  Objective Vulva:  normal appearing vulva with no masses, tenderness or lesions Vagina:  normal mucosa, thin grey discharge Cervix:  no cervical motion tenderness and no lesions Uterus:  normal size, contour, position, consistency, mobility, non-tender Adnexa: ovaries:,      Pertinent ROS No burning with urination, frequency or urgency No nausea, vomiting or diarrhea Nor fever chills or other constitutional symptoms   Labs or studies Wet Prep:   A sample of vaginal discharge was obtained from the posterior fornix using a cotton swab. 2 drops of saline were placed on a slide and the cotton swab was immersed in the saline. Microscopic evaluation was performed and results were as follows:  Negative  for yeast  Positive for clue cells , consistent with Bacterial vaginosis Negative for trichomonas  Normal WBC population   Whiff test: Positive     Impression Diagnoses this Encounter::   ICD-9-CM ICD-10-CM   1. Bacterial vaginosis 616.10 N76.0    041.9 A49.9     Established relevant diagnosis(es):   Plan/Recommendations: Meds ordered this encounter  Medications  . metroNIDAZOLE (METROGEL VAGINAL) 0.75 % vaginal gel    Sig: Nightly x 5 nights    Dispense:  70 g    Refill:  0    Labs or Scans Ordered: No orders of the defined types were placed in this encounter.    Management::   Follow up Need to make a yearly      All questions were  answered.

## 2015-08-28 ENCOUNTER — Ambulatory Visit: Payer: BLUE CROSS/BLUE SHIELD | Admitting: Obstetrics & Gynecology

## 2016-06-11 ENCOUNTER — Ambulatory Visit: Payer: BLUE CROSS/BLUE SHIELD | Admitting: Obstetrics and Gynecology

## 2016-06-16 ENCOUNTER — Ambulatory Visit: Payer: BLUE CROSS/BLUE SHIELD | Admitting: Advanced Practice Midwife

## 2016-06-23 ENCOUNTER — Encounter: Payer: Self-pay | Admitting: Advanced Practice Midwife

## 2016-06-23 ENCOUNTER — Ambulatory Visit (INDEPENDENT_AMBULATORY_CARE_PROVIDER_SITE_OTHER): Payer: Managed Care, Other (non HMO) | Admitting: Advanced Practice Midwife

## 2016-06-23 ENCOUNTER — Ambulatory Visit: Payer: BLUE CROSS/BLUE SHIELD | Admitting: Advanced Practice Midwife

## 2016-06-23 VITALS — BP 112/60 | HR 76 | Ht 61.0 in | Wt 142.0 lb

## 2016-06-23 DIAGNOSIS — O009 Unspecified ectopic pregnancy without intrauterine pregnancy: Secondary | ICD-10-CM | POA: Insufficient documentation

## 2016-06-23 DIAGNOSIS — N760 Acute vaginitis: Secondary | ICD-10-CM | POA: Diagnosis not present

## 2016-06-23 DIAGNOSIS — N898 Other specified noninflammatory disorders of vagina: Secondary | ICD-10-CM | POA: Diagnosis not present

## 2016-06-23 DIAGNOSIS — B9689 Other specified bacterial agents as the cause of diseases classified elsewhere: Secondary | ICD-10-CM

## 2016-06-23 MED ORDER — METRONIDAZOLE 500 MG PO TABS: 500.0000 mg | ORAL_TABLET | Freq: Two times a day (BID) | ORAL | 0 refills | Status: DC

## 2016-06-23 NOTE — Patient Instructions (Signed)
Clomiphene tablets What is this medicine? CLOMIPHENE (KLOE mi feen) is a fertility drug that increases the chance of pregnancy. It helps women ovulate (produce a mature egg) during their cycle. This medicine may be used for other purposes; ask your health care provider or pharmacist if you have questions. COMMON BRAND NAME(S): Clomid, Serophene What should I tell my health care provider before I take this medicine? They need to know if you have any of these conditions: -adrenal gland disease -blood vessel disease or blood clots -cyst on the ovary -endometriosis -liver disease -ovarian cancer -pituitary gland disease -vaginal bleeding that has not been evaluated -an unusual or allergic reaction to clomiphene, other medicines, foods, dyes, or preservatives -pregnant (should not be used if you are already pregnant) -breast-feeding How should I use this medicine? Take this medicine by mouth with a glass of water. Follow the directions on the prescription label. Take exactly as directed for the exact number of days prescribed. Take your doses at regular intervals. Most women take this medicine for a 5 day period, but the length of treatment may be adjusted. Your doctor will give you a start date for this medication and will give you instructions on proper use. Do not take your medicine more often than directed. Talk to your pediatrician regarding the use of this medicine in children. Special care may be needed. Overdosage: If you think you have taken too much of this medicine contact a poison control center or emergency room at once. NOTE: This medicine is only for you. Do not share this medicine with others. What if I miss a dose? If you miss a dose, take it as soon as you can. If it is almost time for your next dose, take only that dose. Do not take double or extra doses. What may interact with this medicine? -herbal or dietary supplements, like blue cohosh, black cohosh, chasteberry, or  DHEA -prasterone This list may not describe all possible interactions. Give your health care provider a list of all the medicines, herbs, non-prescription drugs, or dietary supplements you use. Also tell them if you smoke, drink alcohol, or use illegal drugs. Some items may interact with your medicine. What should I watch for while using this medicine? Make sure you understand how and when to use this medicine. You need to know when you are ovulating and when to have sexual intercourse. This will increase the chance of a pregnancy. Visit your doctor or health care professional for regular checks on your progress. You may need tests to check the hormone levels in your blood or you may have to use home-urine tests to check for ovulation. Try to keep any appointments. Compared to other fertility treatments, this medicine does not greatly increase your chances of having multiple babies. An increased chance of having twins may occur in roughly 5 out of every 100 women who take this medication. Stop taking this medicine at once and contact your doctor or health care professional if you think you are pregnant. This medicine is not for long-term use. Most women that benefit from this medicine do so within the first three cycles (months). Your doctor or health care professional will monitor your condition. This medicine is usually used for a total of 6 cycles of treatment. You may get drowsy or dizzy. Do not drive, use machinery, or do anything that needs mental alertness until you know how this drug affects you. Do not stand or sit up quickly. This reduces the risk of dizzy or   fainting spells. Drinking alcoholic beverages or smoking tobacco may decrease your chance of becoming pregnant. Limit or stop alcohol and tobacco use during your fertility treatments. What side effects may I notice from receiving this medicine? Side effects that you should report to your doctor or health care professional as soon as  possible: -allergic reactions like skin rash, itching or hives, swelling of the face, lips, or tongue -breathing problems -changes in vision -fluid retention -nausea, vomiting -pelvic pain or bloating -severe abdominal pain -sudden weight gain Side effects that usually do not require medical attention (report to your doctor or health care professional if they continue or are bothersome): -breast discomfort -hot flashes -mild pelvic discomfort -mild nausea This list may not describe all possible side effects. Call your doctor for medical advice about side effects. You may report side effects to FDA at 1-800-FDA-1088. Where should I keep my medicine? Keep out of the reach of children. Store at room temperature between 15 and 30 degrees C (59 and 86 degrees F). Protect from heat, light, and moisture. Throw away any unused medicine after the expiration date. NOTE: This sheet is a summary. It may not cover all possible information. If you have questions about this medicine, talk to your doctor, pharmacist, or health care provider.  2018 Elsevier/Gold Standard (2007-07-17 22:21:06)  

## 2016-06-23 NOTE — Progress Notes (Signed)
[facility] Visit  Patient name: Jennifer [name] MRN [id number]  Date of birth: 04/08/1991  CC & HPI:  Jennifer [name] is a 26 y.o. African American female presenting today for possible BV (odor, irritation).  Has had BV 2-3 x/year (but feels like she "keeps getting it all the time!).  Has been trying to get pregnant for 18 months.  Married, husband doesn't have any kids.  Pt has 10 YO son and had a L ectopic pg (MTX).  Has intercourse 3-4 times a week, regular menstrual cycles.    Pertinent History Reviewed:  Medical & Surgical Hx:   Past Medical History:  Diagnosis Date  . Ectopic pregnancy    MTX 07/2012   Past Surgical History:  Procedure Laterality Date  . NO PAST SURGERIES     Family History  Problem Relation Age of Onset  . Cancer Maternal Grandmother   . Heart disease Maternal Grandmother   . Breast cancer Maternal Grandmother   . Cancer Maternal Grandfather   . Fibromyalgia Mother   . Other Mother     concussion  . Other Sister     pre-eclampsia  . Lupus Sister   . Kidney disease Sister   . Asthma Son     Current Outpatient Prescriptions:  .  ALPRAZolam (XANAX PO), Take by mouth as needed., Disp: , Rfl:  .  ibuprofen (ADVIL,MOTRIN) 200 MG tablet, Take 400 mg by mouth as needed., Disp: , Rfl:  .  metroNIDAZOLE (FLAGYL) 500 MG tablet, Take 1 tablet (500 mg total) by mouth 2 (two) times daily., Disp: 14 tablet, Rfl: 0 Social History: Reviewed -  reports that she has been smoking Cigarettes.  She has a 5.00 pack-year smoking history. She has never used smokeless tobacco.  Review of Systems:   Constitutional: Negative for fever and chills Eyes: Negative for visual disturbances Respiratory: Negative for shortness of breath, dyspnea Cardiovascular: Negative for chest pain or palpitations  Gastrointestinal: Negative for vomiting, diarrhea and constipation; no abdominal pain Genitourinary: Negative for dysuria and urgency, vaginal irritation or  itching Musculoskeletal: Negative for back pain, joint pain, myalgias  Neurological: Negative for dizziness and headaches    Objective Findings:    Physical Examination: General appearance - well appearing, and in no distress Mental status - alert, oriented to person, place, and time Chest:  Normal respiratory effort Heart - normal rate and regular rhythm Abdomen:  Soft, nontender Pelvic: JXB:JYNWG amount white frothy dc w/amine odor. Wet prep few clues Musculoskeletal:  Normal range of motion without pain Extremities:  No edema    No results found for this or any previous visit (from the past 24 hour(s)).    Assessment & Plan:  A:   BV  Fertility issues P:  rx flagyl  Semen analysis order given to pt.  If normal, will rx clomid  OTC PNV now   Return if symptoms worsen or fail to improve.  CRESENZO-DISHMAN,Normand Damron CNM 06/23/2016 12:46 PM

## 2016-07-23 ENCOUNTER — Telehealth: Payer: Self-pay | Admitting: Advanced Practice Midwife

## 2016-07-23 NOTE — Telephone Encounter (Signed)
Pt called stating that she would like to know the results of her husband's sperm count. PLease contact pt

## 2016-07-23 NOTE — Telephone Encounter (Signed)
Pt called requesting husbands sperm count results. Informed pt that Drenda Freeze would have those and she is out of the office today. Informed pt that I would leave pts name and contact number for Drenda Freeze when she returns. Pt verbalized understanding.

## 2016-07-27 ENCOUNTER — Telehealth: Payer: Self-pay | Admitting: Advanced Practice Midwife

## 2016-07-27 NOTE — Telephone Encounter (Signed)
Pt called stating that she is awaiting for the results of her husbands Sperm count. Please contact pt

## 2016-09-21 ENCOUNTER — Other Ambulatory Visit: Payer: Self-pay | Admitting: Advanced Practice Midwife

## 2016-09-21 DIAGNOSIS — R300 Dysuria: Secondary | ICD-10-CM

## 2016-09-21 NOTE — Progress Notes (Signed)
Urinalysis/culture for dysuria

## 2017-08-19 ENCOUNTER — Emergency Department (HOSPITAL_COMMUNITY)
Admission: EM | Admit: 2017-08-19 | Discharge: 2017-08-19 | Disposition: A | Discharge status: Home / Self Care | Payer: BLUE CROSS/BLUE SHIELD | Source: Home / Self Care | Attending: Emergency Medicine | Admitting: Emergency Medicine

## 2017-08-19 ENCOUNTER — Emergency Department (HOSPITAL_COMMUNITY): Payer: BLUE CROSS/BLUE SHIELD

## 2017-08-19 ENCOUNTER — Other Ambulatory Visit: Payer: Self-pay

## 2017-08-19 ENCOUNTER — Encounter (HOSPITAL_COMMUNITY): Payer: Self-pay | Admitting: Emergency Medicine

## 2017-08-19 ENCOUNTER — Inpatient Hospital Stay (HOSPITAL_COMMUNITY)
Admission: EM | Admit: 2017-08-19 | Discharge: 2017-08-31 | DRG: 123 | Disposition: A | Discharge status: Home / Self Care | Payer: BLUE CROSS/BLUE SHIELD | Attending: Family Medicine | Admitting: Family Medicine

## 2017-08-19 DIAGNOSIS — H469 Unspecified optic neuritis: Secondary | ICD-10-CM

## 2017-08-19 DIAGNOSIS — R51 Headache: Secondary | ICD-10-CM | POA: Diagnosis present

## 2017-08-19 DIAGNOSIS — Z720 Tobacco use: Secondary | ICD-10-CM | POA: Diagnosis present

## 2017-08-19 DIAGNOSIS — F17210 Nicotine dependence, cigarettes, uncomplicated: Secondary | ICD-10-CM | POA: Diagnosis present

## 2017-08-19 DIAGNOSIS — H5461 Unqualified visual loss, right eye, normal vision left eye: Secondary | ICD-10-CM | POA: Diagnosis present

## 2017-08-19 DIAGNOSIS — K5900 Constipation, unspecified: Secondary | ICD-10-CM | POA: Diagnosis present

## 2017-08-19 DIAGNOSIS — H538 Other visual disturbances: Secondary | ICD-10-CM | POA: Diagnosis present

## 2017-08-19 HISTORY — DX: Unspecified optic neuritis: H46.9

## 2017-08-19 LAB — CBC WITH DIFFERENTIAL/PLATELET
Basophils Absolute: 0 10*3/uL (ref 0.0–0.1)
Basophils Relative: 0 %
Eosinophils Absolute: 0.1 10*3/uL (ref 0.0–0.7)
Eosinophils Relative: 1 %
HCT: 44.3 % (ref 36.0–46.0)
Hemoglobin: 14.2 g/dL (ref 12.0–15.0)
Lymphocytes Relative: 37 %
Lymphs Abs: 1.9 10*3/uL (ref 0.7–4.0)
MCH: 27.8 pg (ref 26.0–34.0)
MCHC: 32.1 g/dL (ref 30.0–36.0)
MCV: 86.7 fL (ref 78.0–100.0)
Monocytes Absolute: 0.3 10*3/uL (ref 0.1–1.0)
Monocytes Relative: 5 %
Neutro Abs: 2.9 10*3/uL (ref 1.7–7.7)
Neutrophils Relative %: 57 %
Platelets: 181 10*3/uL (ref 150–400)
RBC: 5.11 MIL/uL (ref 3.87–5.11)
RDW: 13.2 % (ref 11.5–15.5)
WBC: 5.1 10*3/uL (ref 4.0–10.5)

## 2017-08-19 LAB — URINALYSIS, ROUTINE W REFLEX MICROSCOPIC
Bilirubin Urine: NEGATIVE
Glucose, UA: NEGATIVE mg/dL
Hgb urine dipstick: NEGATIVE
Ketones, ur: 20 mg/dL — AB
Leukocytes, UA: NEGATIVE
Nitrite: NEGATIVE
Protein, ur: NEGATIVE mg/dL
Specific Gravity, Urine: 1.018 (ref 1.005–1.030)
pH: 6 (ref 5.0–8.0)

## 2017-08-19 LAB — BASIC METABOLIC PANEL
Anion gap: 10 (ref 5–15)
BUN: 8 mg/dL (ref 6–20)
CO2: 24 mmol/L (ref 22–32)
Calcium: 9.3 mg/dL (ref 8.9–10.3)
Chloride: 104 mmol/L (ref 101–111)
Creatinine, Ser: 0.79 mg/dL (ref 0.44–1.00)
GFR calc Af Amer: >60 mL/min (ref >60)
GFR calc non Af Amer: >60 mL/min (ref >60)
Glucose, Bld: 95 mg/dL (ref 65–99)
Potassium: 4.2 mmol/L (ref 3.5–5.1)
Sodium: 138 mmol/L (ref 135–145)

## 2017-08-19 LAB — POC URINE PREG, ED: Preg Test, Ur: NEGATIVE

## 2017-08-19 MED ORDER — SODIUM CHLORIDE 0.9 % IV SOLN: 250.0000 mL | INTRAVENOUS | Status: DC | PRN

## 2017-08-19 MED ORDER — ENOXAPARIN SODIUM 40 MG/0.4ML ~~LOC~~ SOLN
40.0000 mg | SUBCUTANEOUS | Status: DC
  Administered 2017-08-20 – 2017-08-28 (×8): 40 mg via SUBCUTANEOUS
  Filled 2017-08-19 (×9): qty 0.4

## 2017-08-19 MED ORDER — METOCLOPRAMIDE HCL 5 MG/ML IJ SOLN
10.0000 mg | Freq: Once | INTRAMUSCULAR | Status: AC
  Administered 2017-08-19: 10 mg via INTRAVENOUS
  Filled 2017-08-19: qty 2

## 2017-08-19 MED ORDER — ENOXAPARIN SODIUM 40 MG/0.4ML ~~LOC~~ SOLN: 40.0000 mg | SUBCUTANEOUS | Status: DC

## 2017-08-19 MED ORDER — SODIUM CHLORIDE 0.9% FLUSH
3.0000 mL | INTRAVENOUS | Status: DC | PRN
  Administered 2017-08-26: 3 mL via INTRAVENOUS
  Filled 2017-08-19: qty 3

## 2017-08-19 MED ORDER — OXYCODONE HCL 5 MG PO TABS
5.0000 mg | ORAL_TABLET | ORAL | Status: DC | PRN
  Administered 2017-08-19 – 2017-08-20 (×2): 5 mg via ORAL
  Filled 2017-08-19 (×2): qty 1

## 2017-08-19 MED ORDER — TETRACAINE HCL 0.5 % OP SOLN
2.0000 [drp] | Freq: Once | OPHTHALMIC | Status: AC
  Administered 2017-08-19: 2 [drp] via OPHTHALMIC
  Filled 2017-08-19: qty 4

## 2017-08-19 MED ORDER — GADOBENATE DIMEGLUMINE 529 MG/ML IV SOLN
13.0000 mL | Freq: Once | INTRAVENOUS | Status: AC | PRN
  Administered 2017-08-19: 13 mL via INTRAVENOUS

## 2017-08-19 MED ORDER — SODIUM CHLORIDE 0.9% FLUSH
3.0000 mL | Freq: Two times a day (BID) | INTRAVENOUS | Status: DC
  Administered 2017-08-20 – 2017-08-28 (×16): 3 mL via INTRAVENOUS

## 2017-08-19 MED ORDER — DIPHENHYDRAMINE HCL 50 MG/ML IJ SOLN
12.5000 mg | Freq: Once | INTRAMUSCULAR | Status: AC
  Administered 2017-08-19: 12.5 mg via INTRAVENOUS
  Filled 2017-08-19: qty 1

## 2017-08-19 MED ORDER — FLUORESCEIN SODIUM 1 MG OP STRP
1.0000 | ORAL_STRIP | Freq: Once | OPHTHALMIC | Status: AC
  Administered 2017-08-19: 1 via OPHTHALMIC
  Filled 2017-08-19: qty 1

## 2017-08-19 NOTE — ED Notes (Signed)
Taken to MRI at this time

## 2017-08-19 NOTE — ED Provider Notes (Signed)
[facility] EMERGENCY DEPARTMENT Provider Note   CSN: [id number] Arrival date & time: 08/19/17  1834     History   Chief Complaint Chief Complaint  Patient presents with  . Optic neuritis rule out    HPI Jennifer [name] is a 27 y.o. female with no significant past medical history, who presents to ED for evaluation of 1 week history of progressively worsening right-sided headache and gradual vision loss in her right eye.  States that she began having blurry vision in the right eye but has now progressed to only being able to see in the lower visual fields.  She was seen and evaluated at [facility], ED earlier today with MRI of the brain done which showed enlarged right optic nerve.  She was sent to ophthalmology who then sent her to this ED for further evaluation, neurology consult and possible IV steroids. Reports shooting pain with flexion of the neck. Uncle has history of MS.  HPI  Past Medical History:  Diagnosis Date  . Ectopic pregnancy    MTX 07/2012    Patient Active Problem List   Diagnosis Date Noted  . Ectopic pregnancy     Past Surgical History:  Procedure Laterality Date  . NO PAST SURGERIES       OB History    Gravida  2   Para  1   Term  1   Preterm      AB  1   Living  1     SAB      TAB      Ectopic  1   Multiple      Live Births  1            Home Medications    Prior to Admission medications   Medication Sig Start Date End Date Taking? Authorizing Provider  acetaminophen (TYLENOL) 500 MG tablet Take 1,000 mg by mouth every 6 (six) hours as needed for headache.    [provider]  aspirin-acetaminophen-caffeine (EXCEDRIN MIGRAINE) [phone] MG tablet Take 2 tablets by mouth every 6 (six) hours as needed for headache or migraine.    [provider]  diphenhydrAMINE (SOMINEX) 25 MG tablet Take 25 mg by mouth daily as needed for sleep.    [provider]    Family History Family  History  Problem Relation Age of Onset  . Cancer Maternal Grandmother   . Heart disease Maternal Grandmother   . Breast cancer Maternal Grandmother   . Cancer Maternal Grandfather   . Fibromyalgia Mother   . Other Mother        concussion  . Other Sister        pre-eclampsia  . Lupus Sister   . Kidney disease Sister   . Asthma Son     Social History Social History   Tobacco Use  . Smoking status: Current Every Day Smoker    Packs/day: 0.50    Years: 10.00    Pack years: 5.00    Types: Cigarettes  . Smokeless tobacco: Never Used  Substance Use Topics  . Alcohol use: Yes    Alcohol/week: 4.2 oz    Types: 4 Cans of beer, 3 Shots of liquor per week    Comment: twice a month  . Drug use: Yes    Types: Marijuana    Comment: once daily     Allergies   Patient has no known allergies.   Review of Systems Review of Systems  Constitutional: Negative  for appetite change, chills and fever.  HENT: Negative for ear pain, rhinorrhea, sneezing and sore throat.   Eyes: Positive for visual disturbance. Negative for photophobia.  Respiratory: Negative for cough, chest tightness, shortness of breath and wheezing.   Cardiovascular: Negative for chest pain and palpitations.  Gastrointestinal: Negative for abdominal pain, blood in stool, constipation, diarrhea, nausea and vomiting.  Genitourinary: Negative for dysuria, hematuria and urgency.  Musculoskeletal: Positive for neck pain. Negative for myalgias.  Skin: Negative for rash.  Neurological: Positive for headaches. Negative for dizziness, weakness and light-headedness.     Physical Exam Updated Vital Signs BP 102/65   Pulse 69   Temp 98.8 F (37.1 C) (Oral)   Resp 18   Ht 5\' 1"  (1.549 m)   Wt 65.8 kg (145 lb)   LMP 08/05/2017   SpO2 98%   BMI 27.40 kg/m   Physical Exam  Constitutional: She appears well-developed and well-nourished. No distress.  HENT:  Head: Normocephalic and atraumatic.  Nose: Nose normal.    Eyes: Pupils are equal, round, and reactive to light. Conjunctivae and EOM are normal. Left eye exhibits no discharge. No scleral icterus.  Neck: Normal range of motion. Neck supple.  Cardiovascular: Normal rate, regular rhythm, normal heart sounds and intact distal pulses. Exam reveals no gallop and no friction rub.  No murmur heard. Pulmonary/Chest: Effort normal and breath sounds normal. No respiratory distress.  Abdominal: Soft. Bowel sounds are normal. She exhibits no distension. There is no tenderness. There is no guarding.  Musculoskeletal: Normal range of motion. She exhibits no edema.  Neurological: She is alert. She exhibits normal muscle tone. Coordination normal.  Reports vision loss in right upper visual fields.  Pain with EOMs of the right eye.  Skin: Skin is warm and dry. No rash noted.  Psychiatric: She has a normal mood and affect.  Nursing note and vitals reviewed.    ED Treatments / Results  Labs (all labs ordered are listed, but only abnormal results are displayed) Labs Reviewed - No data to display  EKG None  Radiology Ct Head Wo Contrast  Result Date: 08/19/2017 CLINICAL DATA:  Headache EXAM: CT HEAD WITHOUT CONTRAST TECHNIQUE: Contiguous axial images were obtained from the base of the skull through the vertex without intravenous contrast. COMPARISON:  None. FINDINGS: Brain: No acute intracranial abnormality. Specifically, no hemorrhage, hydrocephalus, mass lesion, acute infarction, or significant intracranial injury. Vascular: No hyperdense vessel or unexpected calcification. Skull: No acute calvarial abnormality. Sinuses/Orbits: Visualized paranasal sinuses and mastoids clear. Orbital soft tissues unremarkable. Other: None IMPRESSION: Normal study. Electronically Signed   By: Charlett Nose M.D.   On: 08/19/2017 09:14   Mr Brain Wo Contrast  Result Date: 08/19/2017 CLINICAL DATA:  Severe headache.  RIGHT eye diminished vision. EXAM: MRI HEAD WITHOUT CONTRAST  TECHNIQUE: Multiplanar, multiecho pulse sequences of the brain and surrounding structures were obtained without intravenous contrast. COMPARISON:  CT head earlier today. FINDINGS: Brain: No evidence for acute infarction, hemorrhage, mass lesion, hydrocephalus, or extra-axial fluid. Normal for age cerebral volume. Three to four small foci, subcortical and periventricular white matter signal abnormality, nonspecific, but abnormal for age. Considerations include complicated migraine, vasculitis, chronic infection, demyelinating disease, or idiopathic. No similar findings in the brainstem or cerebellum. Vascular: Flow voids are maintained throughout the carotid, basilar, and vertebral arteries. There are no areas of chronic hemorrhage. Skull and upper cervical spine: Normal marrow signal. No upper cervical disc pathology. Mild nasopharyngeal adenoidal hypertrophy, nonspecific. Sinuses/Orbits: Paranasal sinuses are clear. The  RIGHT optic nerve is twice the size of the LEFT, and demonstrates T2 hyperintensity with restricted diffusion. Findings are consistent with RIGHT optic neuritis. Other: Compared with prior CT, the RIGHT optic nerve is confirmed to be enlarged. IMPRESSION: RIGHT optic nerve enlargement, T2 hyperintensity, and restricted diffusion consistent with optic neuritis. Small supratentorial white matter lesions, atypical for demyelinating disease; only one is periventricular. A call has been placed to the ordering provider. Electronically Signed   By: Elsie Stain M.D.   On: 08/19/2017 11:54    Procedures Procedures (including critical care time)  Medications Ordered in ED Medications - No data to display   Initial Impression / Assessment and Plan / ED Course  I have reviewed the triage vital signs and the nursing notes.  Pertinent labs & imaging results that were available during my care of the patient were reviewed by me and considered in my medical decision making (see chart for details).      Patient presents to ED for evaluation of gradually worsening 1 week history of right-sided headache and right-sided vision loss.  She also reports pain with EOMs on the right side.  She was seen and evaluated at any pain ED earlier today and MRI of the brain was done and showed enlarged right optic nerve.  She was then sent to pathology who then sent her here back to the ED for neurology consult and possible IV steroids.  I spoke to Dr. Jerrell Belfast from neurology who evaluated the patient and would like further imaging including MRI of the brain with contrast, MRI of the cervical spine and orbits.  He would prefer beginning treatment after these imaging studies result.  I spoke to the hospitalist who will admit the patient.   Portions of this note were generated with Scientist, clinical (histocompatibility and immunogenetics). Dictation errors may occur despite best attempts at proofreading.   Final Clinical Impressions(s) / ED Diagnoses   Final diagnoses:  None    ED Discharge Orders    None       Dietrich Pates, PA-C 08/19/17 2024    Abelino Derrick, MD 08/19/17 2140

## 2017-08-19 NOTE — Discharge Instructions (Addendum)
As discussed, I have spoken with Dr. Sherryll Burger (eye doctor) and he will see you in his office today.  Go directly to his office.  You may also need to arrange follow-up with a neurologist and I have listed two groups for you to contact

## 2017-08-19 NOTE — ED Notes (Signed)
Patient transported to MRI 

## 2017-08-19 NOTE — ED Triage Notes (Signed)
PT c/o headache on the right side of her head unrelieved by tylenol, aspirin, ibuprofen and benadryl x1 week. PT states everyday the vision in her right eye has been decreasing. PT states she can only see large shapes.

## 2017-08-19 NOTE — ED Notes (Signed)
Patient transported to CT 

## 2017-08-19 NOTE — ED Notes (Signed)
Pt reports that her vision slowly declined over 1 week  Pain is sharp   She has been treating with Benadryl and Migraine meds  Pt has never been dx'd with migraines

## 2017-08-19 NOTE — ED Provider Notes (Signed)
Patient placed in Quick Look pathway, seen and evaluated   Chief Complaint: visual change  HPI:   Jennifer [name] is a 27 y.o. female who presents to the ED with c/o decreased vision in right eye that has gotten progressively worse over the past week. Patient was evaluated at [facility] ED today and had MRI right optic nerve enlargement c/w optic neuritis. Patient was sent to [facility] and from there was sent to the ED. Patient with family hx of MS. Patient here to see neurologist and IV steroids.   ROS: Eye: visual changes over the past week.  Physical Exam:   Gen: No distress  Neuro: Awake and Alert  Skin: Warm and dry  Eyes: right eye pain with movement of the eye   Ct Head Wo Contrast  Result Date: 08/19/2017 CLINICAL DATA:  Headache EXAM: CT HEAD WITHOUT CONTRAST TECHNIQUE: Contiguous axial images were obtained from the base of the skull through the vertex without intravenous contrast. COMPARISON:  None. FINDINGS: Brain: No acute intracranial abnormality. Specifically, no hemorrhage, hydrocephalus, mass lesion, acute infarction, or significant intracranial injury. Vascular: No hyperdense vessel or unexpected calcification. Skull: No acute calvarial abnormality. Sinuses/Orbits: Visualized paranasal sinuses and mastoids clear. Orbital soft tissues unremarkable. Other: None IMPRESSION: Normal study. Electronically Signed   By: Charlett Nose M.D.   On: 08/19/2017 09:14   Mr Brain Wo Contrast  Result Date: 08/19/2017 CLINICAL DATA:  Severe headache.  RIGHT eye diminished vision. EXAM: MRI HEAD WITHOUT CONTRAST TECHNIQUE: Multiplanar, multiecho pulse sequences of the brain and surrounding structures were obtained without intravenous contrast. COMPARISON:  CT head earlier today. FINDINGS: Brain: No evidence for acute infarction, hemorrhage, mass lesion, hydrocephalus, or extra-axial fluid. Normal for age cerebral volume. Three to four small foci, subcortical and periventricular white matter  signal abnormality, nonspecific, but abnormal for age. Considerations include complicated migraine, vasculitis, chronic infection, demyelinating disease, or idiopathic. No similar findings in the brainstem or cerebellum. Vascular: Flow voids are maintained throughout the carotid, basilar, and vertebral arteries. There are no areas of chronic hemorrhage. Skull and upper cervical spine: Normal marrow signal. No upper cervical disc pathology. Mild nasopharyngeal adenoidal hypertrophy, nonspecific. Sinuses/Orbits: Paranasal sinuses are clear. The RIGHT optic nerve is twice the size of the LEFT, and demonstrates T2 hyperintensity with restricted diffusion. Findings are consistent with RIGHT optic neuritis. Other: Compared with prior CT, the RIGHT optic nerve is confirmed to be enlarged. IMPRESSION: RIGHT optic nerve enlargement, T2 hyperintensity, and restricted diffusion consistent with optic neuritis. Small supratentorial white matter lesions, atypical for demyelinating disease; only one is periventricular. A call has been placed to the ordering provider. Electronically Signed   By: Elsie Stain M.D.   On: 08/19/2017 11:54     Initiation of care has begun. The patient has been counseled on the process, plan, and necessity for staying for the completion/evaluation, and the remainder of the medical screening examination    Janne Napoleon, NP 08/19/17 Margretta Ditty    Gerhard Munch, MD 08/20/17 [phone]

## 2017-08-19 NOTE — ED Notes (Signed)
Dr Cook in to assess 

## 2017-08-19 NOTE — ED Triage Notes (Addendum)
Pt states 1 week ago her right eye vision has gotten progressively worse. Pt has no vision out of her right eye currently. She was an [employer]. She states she had an MRI done there and was sent to [facility]. They state she has "swelling of her right optic nerve and a family hx of MS and will need Neurology eval with IV steroids if her MRI brain and orbit w and w/o is positive" and sent her here for this. MRI she had an [facility] shows swollen optic nerve.

## 2017-08-19 NOTE — ED Notes (Signed)
Visual acuity  Right eye: 20/200 Left eye 20/20

## 2017-08-19 NOTE — H&P (Signed)
History and Physical    MI BALLA ZOX:[id number] DOB: 1990/06/27 DOA: 08/19/2017  PCP: Patient, No Pcp Per  Patient coming from: home   Chief Complaint: headache and vision loss  HPI: Jennifer [name] is a 27 y.o. female with hx smoking presenting with one week pounding right-sided headache and progressive right eye visual loss. Says pain is constant and moderate/severe. Vision loss has been gradual but now can only see things in right lower field with the right eye. No weakness or numbness. Hasn't happened before. No hx autoimmune disease.  ED Course: Imaging, neuro consult [facility])  Review of Systems: As per HPI otherwise 10 point review of systems negative.    Past Medical History:  Diagnosis Date  . Ectopic pregnancy    MTX 07/2012    Past Surgical History:  Procedure Laterality Date  . NO PAST SURGERIES       reports that she has been smoking cigarettes.  She has a 5.00 pack-year smoking history. She has never used smokeless tobacco. She reports that she drinks about 4.2 oz of alcohol per week. She reports that she has current or past drug history. Drug: Marijuana.  No Known Allergies  Family History  Problem Relation Age of Onset  . Cancer Maternal Grandmother   . Heart disease Maternal Grandmother   . Breast cancer Maternal Grandmother   . Cancer Maternal Grandfather   . Fibromyalgia Mother   . Other Mother        concussion  . Other Sister        pre-eclampsia  . Lupus Sister   . Kidney disease Sister   . Asthma Son     Prior to Admission medications   Medication Sig Start Date End Date Taking? Authorizing Provider  acetaminophen (TYLENOL) 500 MG tablet Take 1,000 mg by mouth every 6 (six) hours as needed for headache.   Yes [provider]  aspirin-acetaminophen-caffeine (EXCEDRIN MIGRAINE) [phone] MG tablet Take 2 tablets by mouth every 6 (six) hours as needed for headache or migraine.   Yes [provider]  diphenhydrAMINE  (BENADRYL) 25 MG tablet Take 25 mg by mouth daily as needed (headache).   Yes [provider]  Multiple Vitamins-Minerals (EMERGEN-C VITAMIN C) PACK Take 1 packet by mouth daily as needed (immune system boost).   Yes [provider]    Physical Exam: Vitals:   08/19/17 1839 08/19/17 1840 08/19/17 1945  BP: 117/69  102/65  Pulse: 88  69  Resp: 18    Temp: 98.8 F (37.1 C)    TempSrc: Oral    SpO2: 100%  98%  Weight:  65.8 kg (145 lb)   Height:  5\' 1"  (1.549 m)     Constitutional: No acute distress Head: Atraumatic Eyes: Conjunctiva clear ENM: Moist mucous membranes. Normal dentition.  Neck: Supple Respiratory: Clear to auscultation bilaterally, no wheezing/rales/rhonchi. Normal respiratory effort. No accessory muscle use. . Cardiovascular: Regular rate and rhythm. No murmurs/rubs/gallops. Abdomen: Non-tender, non-distended. No masses. No rebound or guarding. Positive bowel sounds. Musculoskeletal: No joint deformity upper and lower extremities. Normal ROM, no contractures. Normal muscle tone.  Skin: No rashes, lesions, or ulcers.  Extremities: No peripheral edema. Palpable peripheral pulses. Neurologic: diminished field of vision right eye. PERRL. Cn 2-12 grossly intact Psychiatric: Normal insight and judgement.   Labs on Admission: I have personally reviewed following labs and imaging studies  CBC: Recent Labs  Lab 08/19/17 1009  WBC 5.1  NEUTROABS 2.9  HGB 14.2  HCT 44.3  MCV 86.7  PLT 181   Basic Metabolic Panel: Recent Labs  Lab 08/19/17 1009  NA 138  K 4.2  CL 104  CO2 24  GLUCOSE 95  BUN 8  CREATININE 0.79  CALCIUM 9.3   GFR: Estimated Creatinine Clearance: 91.7 mL/min (by C-G formula based on SCr of 0.79 mg/dL). Liver Function Tests: No results for input(s): AST, ALT, ALKPHOS, BILITOT, PROT, ALBUMIN in the last 168 hours. No results for input(s): LIPASE, AMYLASE in the last 168 hours. No results for input(s): AMMONIA in the last  168 hours. Coagulation Profile: No results for input(s): INR, PROTIME in the last 168 hours. Cardiac Enzymes: No results for input(s): CKTOTAL, CKMB, CKMBINDEX, TROPONINI in the last 168 hours. BNP (last 3 results) No results for input(s): PROBNP in the last 8760 hours. HbA1C: No results for input(s): HGBA1C in the last 72 hours. CBG: No results for input(s): GLUCAP in the last 168 hours. Lipid Profile: No results for input(s): CHOL, HDL, LDLCALC, TRIG, CHOLHDL, LDLDIRECT in the last 72 hours. Thyroid Function Tests: No results for input(s): TSH, T4TOTAL, FREET4, T3FREE, THYROIDAB in the last 72 hours. Anemia Panel: No results for input(s): VITAMINB12, FOLATE, FERRITIN, TIBC, IRON, RETICCTPCT in the last 72 hours. Urine analysis:    Component Value Date/Time   COLORURINE YELLOW 08/19/2017 0956   APPEARANCEUR CLEAR 08/19/2017 0956   LABSPEC 1.018 08/19/2017 0956   PHURINE 6.0 08/19/2017 0956   GLUCOSEU NEGATIVE 08/19/2017 0956   HGBUR NEGATIVE 08/19/2017 0956   BILIRUBINUR NEGATIVE 08/19/2017 0956   KETONESUR 20 (A) 08/19/2017 0956   PROTEINUR NEGATIVE 08/19/2017 0956   UROBILINOGEN 0.2 08/04/2012 0750   NITRITE NEGATIVE 08/19/2017 0956   LEUKOCYTESUR NEGATIVE 08/19/2017 0956    Radiological Exams on Admission: Ct Head Wo Contrast  Result Date: 08/19/2017 CLINICAL DATA:  Headache EXAM: CT HEAD WITHOUT CONTRAST TECHNIQUE: Contiguous axial images were obtained from the base of the skull through the vertex without intravenous contrast. COMPARISON:  None. FINDINGS: Brain: No acute intracranial abnormality. Specifically, no hemorrhage, hydrocephalus, mass lesion, acute infarction, or significant intracranial injury. Vascular: No hyperdense vessel or unexpected calcification. Skull: No acute calvarial abnormality. Sinuses/Orbits: Visualized paranasal sinuses and mastoids clear. Orbital soft tissues unremarkable. Other: None IMPRESSION: Normal study. Electronically Signed   By: Charlett Nose M.D.   On: 08/19/2017 09:14   Mr Brain Wo Contrast  Result Date: 08/19/2017 CLINICAL DATA:  Severe headache.  RIGHT eye diminished vision. EXAM: MRI HEAD WITHOUT CONTRAST TECHNIQUE: Multiplanar, multiecho pulse sequences of the brain and surrounding structures were obtained without intravenous contrast. COMPARISON:  CT head earlier today. FINDINGS: Brain: No evidence for acute infarction, hemorrhage, mass lesion, hydrocephalus, or extra-axial fluid. Normal for age cerebral volume. Three to four small foci, subcortical and periventricular white matter signal abnormality, nonspecific, but abnormal for age. Considerations include complicated migraine, vasculitis, chronic infection, demyelinating disease, or idiopathic. No similar findings in the brainstem or cerebellum. Vascular: Flow voids are maintained throughout the carotid, basilar, and vertebral arteries. There are no areas of chronic hemorrhage. Skull and upper cervical spine: Normal marrow signal. No upper cervical disc pathology. Mild nasopharyngeal adenoidal hypertrophy, nonspecific. Sinuses/Orbits: Paranasal sinuses are clear. The RIGHT optic nerve is twice the size of the LEFT, and demonstrates T2 hyperintensity with restricted diffusion. Findings are consistent with RIGHT optic neuritis. Other: Compared with prior CT, the RIGHT optic nerve is confirmed to be enlarged. IMPRESSION: RIGHT optic nerve enlargement, T2 hyperintensity, and restricted diffusion consistent with optic neuritis. Small supratentorial white matter  lesions, atypical for demyelinating disease; only one is periventricular. A call has been placed to the ordering provider. Electronically Signed   By: Elsie Stain M.D.   On: 08/19/2017 11:54      Assessment/Plan Active Problems:   Nicotine abuse   Optic neuritis, right   # Right optic neuritis - probable. Per neuro [facility]), wants further imaging w/ contrast to confirm diagnosis prior to starting IV steroids. - f/u MRIs;  neuro to order tx if indicated when those results arrive - appreciate further neuro recs - oxycodone for pain  # Nicotine dependence - smokes 5-10 cigs/day - declines nicotine patch for now  DVT prophylaxis: scds, lovenox Code Status: full  Family Communication: sister Erskine Squibb [phone]  Disposition Plan: tbd  Consults called: [facility] (neurology)  Admission status: med/surg    Silvano Bilis MD [facility] Pager [phone]  If 7PM-7AM, please contact night-coverage [email or web address] Password [email or web address]  08/19/2017, 9:01 PM

## 2017-08-19 NOTE — ED Provider Notes (Signed)
[facility] EMERGENCY DEPARTMENT Provider Note   CSN: [id number] Arrival date & time: 08/19/17  4098     History   Chief Complaint Chief Complaint  Patient presents with  . Headache  . Loss of Vision    HPI Jennifer [name] is a 27 y.o. female.  HPI   Jennifer [name] is a 27 y.o. female who presents to the Emergency Department complaining of right-sided headache and increasing visual loss of the right eye.  Symptoms have been present for 1 week.  She describes a throbbing, constant pain behind the right ear into her temple and behind her right eye.  Headache was gradual in onset.  She reports a history of decreased vision in her right eye since onset of headache.  She states that she can see light and movement but has pain with movement of her eyes including peripheral and upward gaze.  She denies recent illness, fever, chills, neck pain or stiffness, numbness or weakness of the extremities and vomiting.  She is tried over-the-counter pain relievers and Benadryl without relief.  She does wear corrective lenses, but no contacts.   Past Medical History:  Diagnosis Date  . Ectopic pregnancy    MTX 07/2012    Patient Active Problem List   Diagnosis Date Noted  . Ectopic pregnancy     Past Surgical History:  Procedure Laterality Date  . NO PAST SURGERIES       OB History    Gravida  2   Para  1   Term  1   Preterm      AB  1   Living  1     SAB      TAB      Ectopic  1   Multiple      Live Births  1            Home Medications    Prior to Admission medications   Medication Sig Start Date End Date Taking? Authorizing Provider  acetaminophen (TYLENOL) 500 MG tablet Take 1,000 mg by mouth every 6 (six) hours as needed for headache.   Yes [provider]  aspirin-acetaminophen-caffeine (EXCEDRIN MIGRAINE) [phone] MG tablet Take 2 tablets by mouth every 6 (six) hours as needed for headache or migraine.   Yes [provider]    diphenhydrAMINE (SOMINEX) 25 MG tablet Take 25 mg by mouth daily as needed for sleep.   Yes [provider]    Family History Family History  Problem Relation Age of Onset  . Cancer Maternal Grandmother   . Heart disease Maternal Grandmother   . Breast cancer Maternal Grandmother   . Cancer Maternal Grandfather   . Fibromyalgia Mother   . Other Mother        concussion  . Other Sister        pre-eclampsia  . Lupus Sister   . Kidney disease Sister   . Asthma Son     Social History Social History   Tobacco Use  . Smoking status: Current Every Day Smoker    Packs/day: 0.50    Years: 10.00    Pack years: 5.00    Types: Cigarettes  . Smokeless tobacco: Never Used  Substance Use Topics  . Alcohol use: Yes    Alcohol/week: 4.2 oz    Types: 4 Cans of beer, 3 Shots of liquor per week    Comment: twice a month  . Drug use: Yes    Types: Marijuana  Comment: once daily     Allergies   Patient has no known allergies.   Review of Systems Review of Systems  Constitutional: Negative for activity change, appetite change and fever.  HENT: Negative for facial swelling and trouble swallowing.   Eyes: Positive for photophobia, pain and visual disturbance (decreased vision right eye). Negative for discharge, redness and itching.  Respiratory: Negative for shortness of breath.   Cardiovascular: Negative for chest pain.  Gastrointestinal: Negative for nausea and vomiting.  Musculoskeletal: Negative for neck pain and neck stiffness.  Skin: Negative for rash and wound.  Neurological: Positive for headaches. Negative for dizziness, syncope, facial asymmetry, speech difficulty, weakness and numbness.  Psychiatric/Behavioral: Negative for confusion and decreased concentration.  All other systems reviewed and are negative.    Physical Exam Updated Vital Signs BP 97/69 (BP Location: Right Arm)   Pulse (!) 52   Temp 97.8 F (36.6 C) (Oral)   Resp 16   Ht 5\' 1"  (1.549  m)   Wt 65.8 kg (145 lb)   LMP 08/05/2017   SpO2 96%   BMI 27.40 kg/m   Physical Exam  Constitutional: She is oriented to person, place, and time. She appears well-developed and well-nourished. No distress.  HENT:  Head: Normocephalic and atraumatic.  Mouth/Throat: Oropharynx is clear and moist.  Eyes: Pupils are equal, round, and reactive to light. Conjunctivae and lids are normal. Right eye exhibits no chemosis and no exudate. No foreign body present in the right eye. Left eye exhibits no chemosis. Right conjunctiva is not injected. Right conjunctiva has no hemorrhage.  Slit lamp exam:      The right eye shows no corneal ulcer, no foreign body, no hyphema and no fluorescein uptake.  Right eye pain reproduced with peripheral and upward gaze.  Blurring and poor visualization of the optic nerve on slit-lamp exam  Neck: Normal range of motion and phonation normal. Neck supple. No spinous process tenderness and no muscular tenderness present. No neck rigidity. No Kernig's sign noted.  Cardiovascular: Normal rate, regular rhythm and intact distal pulses.  Pulmonary/Chest: Effort normal and breath sounds normal. No respiratory distress.  Musculoskeletal: Normal range of motion.  Neurological: She is alert and oriented to person, place, and time. She has normal strength. No cranial nerve deficit or sensory deficit. She exhibits normal muscle tone. Coordination and gait normal. GCS eye subscore is 4. GCS verbal subscore is 5. GCS motor subscore is 6.  Reflex Scores:      Tricep reflexes are 2+ on the right side and 2+ on the left side.      Bicep reflexes are 2+ on the right side and 2+ on the left side. CN III-XII grossly intact  Skin: Skin is warm and dry. Capillary refill takes less than 2 seconds. No rash noted.  Psychiatric: She has a normal mood and affect. Thought content normal.  Nursing note and vitals reviewed.    ED Treatments / Results  Labs (all labs ordered are listed, but  only abnormal results are displayed) Labs Reviewed  URINALYSIS, ROUTINE W REFLEX MICROSCOPIC - Abnormal; Notable for the following components:      Result Value   Ketones, ur 20 (*)    All other components within normal limits  BASIC METABOLIC PANEL  CBC WITH DIFFERENTIAL/PLATELET  POC URINE PREG, ED    EKG None  Radiology Ct Head Wo Contrast  Result Date: 08/19/2017 CLINICAL DATA:  Headache EXAM: CT HEAD WITHOUT CONTRAST TECHNIQUE: Contiguous axial images were obtained  from the base of the skull through the vertex without intravenous contrast. COMPARISON:  None. FINDINGS: Brain: No acute intracranial abnormality. Specifically, no hemorrhage, hydrocephalus, mass lesion, acute infarction, or significant intracranial injury. Vascular: No hyperdense vessel or unexpected calcification. Skull: No acute calvarial abnormality. Sinuses/Orbits: Visualized paranasal sinuses and mastoids clear. Orbital soft tissues unremarkable. Other: None IMPRESSION: Normal study. Electronically Signed   By: Charlett Nose M.D.   On: 08/19/2017 09:14   Mr Brain Wo Contrast  Result Date: 08/19/2017 CLINICAL DATA:  Severe headache.  RIGHT eye diminished vision. EXAM: MRI HEAD WITHOUT CONTRAST TECHNIQUE: Multiplanar, multiecho pulse sequences of the brain and surrounding structures were obtained without intravenous contrast. COMPARISON:  CT head earlier today. FINDINGS: Brain: No evidence for acute infarction, hemorrhage, mass lesion, hydrocephalus, or extra-axial fluid. Normal for age cerebral volume. Three to four small foci, subcortical and periventricular white matter signal abnormality, nonspecific, but abnormal for age. Considerations include complicated migraine, vasculitis, chronic infection, demyelinating disease, or idiopathic. No similar findings in the brainstem or cerebellum. Vascular: Flow voids are maintained throughout the carotid, basilar, and vertebral arteries. There are no areas of chronic hemorrhage. Skull  and upper cervical spine: Normal marrow signal. No upper cervical disc pathology. Mild nasopharyngeal adenoidal hypertrophy, nonspecific. Sinuses/Orbits: Paranasal sinuses are clear. The RIGHT optic nerve is twice the size of the LEFT, and demonstrates T2 hyperintensity with restricted diffusion. Findings are consistent with RIGHT optic neuritis. Other: Compared with prior CT, the RIGHT optic nerve is confirmed to be enlarged. IMPRESSION: RIGHT optic nerve enlargement, T2 hyperintensity, and restricted diffusion consistent with optic neuritis. Small supratentorial white matter lesions, atypical for demyelinating disease; only one is periventricular. A call has been placed to the ordering provider. Electronically Signed   By: Elsie Stain M.D.   On: 08/19/2017 11:54    Procedures Procedures (including critical care time)  Medications Ordered in ED Medications  tetracaine (PONTOCAINE) 0.5 % ophthalmic solution 2 drop (2 drops Right Eye Given by Other 08/19/17 1010)  fluorescein ophthalmic strip 1 strip (1 strip Right Eye Given by Other 08/19/17 1011)  metoCLOPramide (REGLAN) injection 10 mg (10 mg Intravenous Given 08/19/17 1011)  diphenhydrAMINE (BENADRYL) injection 12.5 mg (12.5 mg Intravenous Given 08/19/17 1011)     Initial Impression / Assessment and Plan / ED Course  I have reviewed the triage vital signs and the nursing notes.  Pertinent labs & imaging results that were available during my care of the patient were reviewed by me and considered in my medical decision making (see chart for details).      Visual Acuity  Right Eye Distance:  20/200 Left Eye Distance:  20/20 Bilateral Distance:        IOP right eye measured using tonopen:  ~ 19,21,19 mmHg  Patient with unilateral headache and unilateral vision loss.  Gradual onset.  No other focal neurological deficits.   On recheck, patient reports feeling better and headache has resolved but continues to have decreased vision.  Patient  also seen by Dr. Adriana Simas and care plan discussed. MRI results reported to Dr. Adriana Simas, possible optic neuritis.  Will consult ophthalmology.    1245  Consulted ophthalmology, Dr. Sherryll Burger, discussed findings and he recommended patient come to his office for further evaluation.  This was discussed with patient including MRI results and concerns for optic neuritis and possible need for neurology follow-up regarding MS.  Patient verbalized understanding and agrees to care plan.   Final Clinical Impressions(s) / ED Diagnoses   Final diagnoses:  Optic neuritis    ED Discharge Orders    None       Pauline Aus, PA-C 08/19/17 1401    Donnetta Hutching, MD 08/20/17 0730

## 2017-08-19 NOTE — Consult Note (Addendum)
Neurology Consultation  Reason for Consult: Optic neuritis Referring Physician: Dr. Corlis Leak, Dr. Clelia Croft ophthalmology  CC: Right eye vision loss  History is obtained from: Patient, chart  HPI: Jennifer [name] is a 27 y.o. female with no past medical history, who has been sent over for evaluation for possible optic neuritis. She reports a progressively worsening right-sided headache and painful right vision loss that started about a week ago.  She was in her usual state of health until about 1 week ago when she started having some headache on the right side of her head.  She also noted some blurred vision out of her right eye.  She did not notice any symptoms on the left eye.  She said that it seemed initially that her vision loss was more in the right lower quadrant to the right eye but now has progressed to the whole eye being unable to see.  She can barely appreciate light from the right eye.  She was seen at the emergency room at any pain hospital after she was evaluated by ophthalmology and sent there for further work-up. Ophthalmology is concerned for optic neuritis based on clinical exam. An MRI without contrast done at any pain shows restricted diffusion in the right optic nerve. She also reports that she is been having some symptoms of hyperesthesia/numbness alternating in both her arms.  She also reports her symptoms get worse when she is exercising or taking a hot shower.  She also reports some tingling down her spine upon bending her neck.  ROS: ROS was performed and is negative except as noted in the HPI.   Past Medical History:  Diagnosis Date  . Ectopic pregnancy    MTX 07/2012    Family History  Problem Relation Age of Onset  . Cancer Maternal Grandmother   . Heart disease Maternal Grandmother   . Breast cancer Maternal Grandmother   . Cancer Maternal Grandfather   . Fibromyalgia Mother   . Other Mother        concussion  . Other Sister        pre-eclampsia  . Lupus  Sister   . Kidney disease Sister   . Asthma Son   Maternal uncle has multiple sclerosis.  Social History:   reports that she has been smoking cigarettes.  She has a 5.00 pack-year smoking history. She has never used smokeless tobacco. She reports that she drinks about 4.2 oz of alcohol per week. She reports that she has current or past drug history. Drug: Marijuana.  Medications No current facility-administered medications for this encounter.   Current Outpatient Medications:  .  acetaminophen (TYLENOL) 500 MG tablet, Take 1,000 mg by mouth every 6 (six) hours as needed for headache., Disp: , Rfl:  .  aspirin-acetaminophen-caffeine (EXCEDRIN MIGRAINE) 250-250-65 MG tablet, Take 2 tablets by mouth every 6 (six) hours as needed for headache or migraine., Disp: , Rfl:  .  diphenhydrAMINE (SOMINEX) 25 MG tablet, Take 25 mg by mouth daily as needed for sleep., Disp: , Rfl:   Exam: Current vital signs: BP 102/65   Pulse 69   Temp 98.8 F (37.1 C) (Oral)   Resp 18   Ht 5\' 1"  (1.549 m)   Wt 65.8 kg (145 lb)   LMP 08/05/2017   SpO2 98%   BMI 27.40 kg/m  Vital signs in last 24 hours: Temp:  [97.8 F (36.6 C)-98.8 F (37.1 C)] 98.8 F (37.1 C) (05/03 1839) Pulse Rate:  [52-88] 69 (05/03 1945) Resp:  [  16-18] 18 (05/03 1839) BP: (97-117)/(65-74) 102/65 (05/03 1945) SpO2:  [96 %-100 %] 98 % (05/03 1945) Weight:  [65.8 kg (145 lb)] 65.8 kg (145 lb) (05/03 1840) GENERAL: Awake, alert in NAD HEENT: - Normocephalic and atraumatic, dry mm, no LN++, no Thyromegally LUNGS - Clear to auscultation bilaterally with no wheezes CV - S1S2 RRR, no m/r/g, equal pulses bilaterally. ABDOMEN - Soft, nontender, nondistended with normoactive BS Ext: warm, well perfused, intact peripheral pulses, no edema  NEURO:  Mental Status: AA&Ox3  Language: speech is clear.  Naming, repetition, fluency, and comprehension intact Cranial Nerves:right RAPD, Visual acuity light perception in right eye. EOMI, visual  fields full, no facial asymmetry, facial sensation intact, hearing intact, tongue/uvula/soft palate midline, normal sternocleidomastoid and trapezius muscle strength. No evidence of tongue atrophy or fibrillations Motor: 5/5 all over Tone: is normal and bulk is normal Sensation- Intact to light touch bilaterally Coordination: FTN intact bilaterally, no ataxia in BLE. Gait- deferred  Labs I have reviewed labs in epic and the results pertinent to this consultation are: CBC    Component Value Date/Time   WBC 5.1 08/19/2017 1009   RBC 5.11 08/19/2017 1009   HGB 14.2 08/19/2017 1009   HCT 44.3 08/19/2017 1009   PLT 181 08/19/2017 1009   MCV 86.7 08/19/2017 1009   MCH 27.8 08/19/2017 1009   MCHC 32.1 08/19/2017 1009   RDW 13.2 08/19/2017 1009   LYMPHSABS 1.9 08/19/2017 1009   MONOABS 0.3 08/19/2017 1009   EOSABS 0.1 08/19/2017 1009   BASOSABS 0.0 08/19/2017 1009    CMP     Component Value Date/Time   NA 138 08/19/2017 1009   K 4.2 08/19/2017 1009   CL 104 08/19/2017 1009   CO2 24 08/19/2017 1009   GLUCOSE 95 08/19/2017 1009   BUN 8 08/19/2017 1009   CREATININE 0.79 08/19/2017 1009   CALCIUM 9.3 08/19/2017 1009   AST 13 08/04/2012 1640   GFRNONAA >60 08/19/2017 1009   GFRAA >60 08/19/2017 1009   Imaging I have reviewed the images obtained: MRI examination of the brain without contrast done at any pain hospital shows restricted diffusion of the right optic nerve.  Assessment:  27 year old woman with no past medical history presenting for 1 week history of headaches and right eye painful vision loss, with ophthalmologically examination concerning for optic neuritis and an MRI brain without contrast showing restricted diffusion of the right optic nerve concerning for optic neuritis.   Patient needs further imaging prior to initiation of steroids to evaluate for spinal lesions which might be consistent with neuromyelitis optica. I would recommend MRI of the orbits with and  without contrast as well as MRI of the C-spine given history of Lhermitte's phenomenon  Impression: Evaluate for optic neuritis Evaluate for multiple sclerosis/neuromyelitis optica  Recommendations: Admit for further work-up MRI of the brain with contrast MRI of the orbits with and without contrast MRI of the C-spine with and without contrast Based on the results, I would recommend starting 5 days of Solu-Medrol 1 g IV. She will need outpatient follow-up with Dr. Epimenio Foot at [facility] neurology for initiation of disease modifying therapy for the demyelinating process once confirmed.  I will defer the decision to perform a spinal tap to outpatient neurology.  -- Milon Dikes, MD [facility] Pager: [phone] If 7pm to 7am, please call on call as listed on AMION.   Addendum after MRI MRI of the brain shows few FLAIR hyperintense lesions with only one that is suspicious for  a periventricular MS-like lesion, but none of these lesions enhancing but shows edematous right optic nerve with restricted diffusion and enhancement. No lesions in Cervical spinal cord.  Updated recommendations -We will start 5 days of Solu-Medrol IV 1 g - Pantoprazole for GI prophylaxis -PT, OT -Check for neuromyelitis optica antibodies-ordered -She will need outpatient follow-up with Dr. Epimenio Foot at [facility] neurology for initiation of disease modifying therapy for the demyelinating process once confirmed.  I will defer the decision to perform a spinal tap to outpatient neurology. -We will continue to follow with you  -- Milon Dikes, MD [facility] Pager: [phone] If 7pm to 7am, please call on call as listed on AMION.

## 2017-08-20 DIAGNOSIS — H469 Unspecified optic neuritis: Principal | ICD-10-CM

## 2017-08-20 LAB — CREATININE, SERUM
Creatinine, Ser: 0.87 mg/dL (ref 0.44–1.00)
GFR calc Af Amer: >60 mL/min (ref >60)
GFR calc non Af Amer: >60 mL/min (ref >60)

## 2017-08-20 LAB — CBC
HCT: 43.8 % (ref 36.0–46.0)
Hemoglobin: 14.4 g/dL (ref 12.0–15.0)
MCH: 28.3 pg (ref 26.0–34.0)
MCHC: 32.9 g/dL (ref 30.0–36.0)
MCV: 86.1 fL (ref 78.0–100.0)
Platelets: 181 10*3/uL (ref 150–400)
RBC: 5.09 MIL/uL (ref 3.87–5.11)
RDW: 13.4 % (ref 11.5–15.5)
WBC: 7.5 10*3/uL (ref 4.0–10.5)

## 2017-08-20 LAB — HIV ANTIBODY (ROUTINE TESTING W REFLEX): HIV Screen 4th Generation wRfx: NONREACTIVE

## 2017-08-20 MED ORDER — SODIUM CHLORIDE 0.9 % IV SOLN
1000.0000 mg | Freq: Every day | INTRAVENOUS | Status: AC
  Administered 2017-08-20 – 2017-08-24 (×5): 1000 mg via INTRAVENOUS
  Filled 2017-08-20 (×7): qty 8

## 2017-08-20 MED ORDER — OXYCODONE HCL 5 MG PO TABS
5.0000 mg | ORAL_TABLET | ORAL | Status: DC | PRN
  Administered 2017-08-20 – 2017-08-24 (×6): 10 mg via ORAL
  Filled 2017-08-20 (×7): qty 2

## 2017-08-20 MED ORDER — METOCLOPRAMIDE HCL 5 MG/ML IJ SOLN
10.0000 mg | Freq: Once | INTRAMUSCULAR | Status: AC
  Administered 2017-08-20: 10 mg via INTRAVENOUS
  Filled 2017-08-20: qty 2

## 2017-08-20 MED ORDER — PANTOPRAZOLE SODIUM 40 MG PO TBEC
40.0000 mg | DELAYED_RELEASE_TABLET | Freq: Every day | ORAL | Status: DC
  Administered 2017-08-20 – 2017-08-31 (×12): 40 mg via ORAL
  Filled 2017-08-20 (×13): qty 1

## 2017-08-20 MED ORDER — OXYCODONE HCL 5 MG PO TABS
5.0000 mg | ORAL_TABLET | Freq: Once | ORAL | Status: AC
  Administered 2017-08-20: 5 mg via ORAL
  Filled 2017-08-20: qty 1

## 2017-08-20 NOTE — Progress Notes (Signed)
Dr Wilford Corner returned page. Patient nor family in room. Did not see anyone around nursing station.

## 2017-08-20 NOTE — Progress Notes (Signed)
PROGRESS NOTE  Jennifer [name]  ZOX:[id number] DOB: [birth year] DOA: 08/19/2017 PCP: None Brief Narrative: Jennifer [name] is a previously healthy 27 y.o. female tobacco user who presented with one week of progressive right sided pounding headache associated with diminished blurry, then darkening, vision. She had been referred to ophthalmology who was concerned for optic neuritis, sent to the ED for further work up where an MRI at [facility] showed restricted diffusion in the right optic nerve. Neurology was consulted, recommending MRI of cervical spine due to some transitory numbness in bilateral upper extremities worse with heat, and of the orbits. This showed no lesions in the C-spine and a few FLAIR hyperintensities with one suspicious for MS-like lesion. IV steroids were recommended and she was admitted to [facility].  Assessment & Plan: Right optic neuritis: Total vision loss currently.  - Solumedrol 1g IV daily x5 days (5/4 - 5/8) (w/PPI GI ppx). Administration of first dose was delayed by the patient losing IV access.  - Pain control with oxycodone, will increase to 5-10mg  prn mod-severe pain.  - OT eval ordered  - Neurology continuing to follow. Will need outpatient follow up with Dr. Epimenio Foot at [facility].   Tobacco use: 1/4 - 1/2ppd, refusing nicotine patch.  - Will benefit from cessation counseling prior to discharge.   DVT prophylaxis: Lovenox Code Status: Full Family Communication: Husband at bedside Disposition Plan: Home after IV steroids.  Consultants: Neurology Procedures: None  Antimicrobials:  None   Subjective: Vision and right retroorbital headache continue, improved modestly with oxycodone  Objective: BP 120/76 (BP Location: Left Arm)   Pulse 67   Temp 98.3 F (36.8 C) (Oral)   Resp 18   Ht 5\' 1"  (1.549 m)   Wt 65.8 kg (145 lb)   LMP 08/05/2017   SpO2 100%   BMI 27.40 kg/m   Gen: 27 y.o. female in no distress Pulm: Non-labored breathing room air. Clear to auscultation  bilaterally.  CV: Regular rate and rhythm. No murmur, rub, or gallop. No JVD, no pedal edema. GI: Abdomen soft, non-tender, non-distended, with normoactive bowel sounds. No organomegaly or masses felt. Ext: Warm, no deformities Skin: No rashes, lesions or ulcers Neuro: Alert and oriented. No vision on right, right pupil not directly or consensually reactive. Left pupil only directly reactive. Eye appears normal.  Psych: Judgement and insight appear normal. Mood & affect appropriate.   CBC: Recent Labs  Lab 08/19/17 1009 08/20/17 0056  WBC 5.1 7.5  NEUTROABS 2.9  --   HGB 14.2 14.4  HCT 44.3 43.8  MCV 86.7 86.1  PLT 181 181   Basic Metabolic Panel: Recent Labs  Lab 08/19/17 1009 08/20/17 0056  NA 138  --   K 4.2  --   CL 104  --   CO2 24  --   GLUCOSE 95  --   BUN 8  --   CREATININE 0.79 0.87  CALCIUM 9.3  --    Radiology Studies: Ct Head Wo Contrast  Result Date: 08/19/2017 CLINICAL DATA:  Headache EXAM: CT HEAD WITHOUT CONTRAST TECHNIQUE: Contiguous axial images were obtained from the base of the skull through the vertex without intravenous contrast. COMPARISON:  None. FINDINGS: Brain: No acute intracranial abnormality. Specifically, no hemorrhage, hydrocephalus, mass lesion, acute infarction, or significant intracranial injury. Vascular: No hyperdense vessel or unexpected calcification. Skull: No acute calvarial abnormality. Sinuses/Orbits: Visualized paranasal sinuses and mastoids clear. Orbital soft tissues unremarkable. Other: None IMPRESSION: Normal study. Electronically Signed   By: Charlett Nose M.D.  On: 08/19/2017 09:14   Mr Brain Wo Contrast  Result Date: 08/19/2017 CLINICAL DATA:  Severe headache.  RIGHT eye diminished vision. EXAM: MRI HEAD WITHOUT CONTRAST TECHNIQUE: Multiplanar, multiecho pulse sequences of the brain and surrounding structures were obtained without intravenous contrast. COMPARISON:  CT head earlier today. FINDINGS: Brain: No evidence for acute  infarction, hemorrhage, mass lesion, hydrocephalus, or extra-axial fluid. Normal for age cerebral volume. Three to four small foci, subcortical and periventricular white matter signal abnormality, nonspecific, but abnormal for age. Considerations include complicated migraine, vasculitis, chronic infection, demyelinating disease, or idiopathic. No similar findings in the brainstem or cerebellum. Vascular: Flow voids are maintained throughout the carotid, basilar, and vertebral arteries. There are no areas of chronic hemorrhage. Skull and upper cervical spine: Normal marrow signal. No upper cervical disc pathology. Mild nasopharyngeal adenoidal hypertrophy, nonspecific. Sinuses/Orbits: Paranasal sinuses are clear. The RIGHT optic nerve is twice the size of the LEFT, and demonstrates T2 hyperintensity with restricted diffusion. Findings are consistent with RIGHT optic neuritis. Other: Compared with prior CT, the RIGHT optic nerve is confirmed to be enlarged. IMPRESSION: RIGHT optic nerve enlargement, T2 hyperintensity, and restricted diffusion consistent with optic neuritis. Small supratentorial white matter lesions, atypical for demyelinating disease; only one is periventricular. A call has been placed to the ordering provider. Electronically Signed   By: Elsie Stain M.D.   On: 08/19/2017 11:54   Mr Laqueta Jean RU Contrast  Result Date: 08/19/2017 CLINICAL DATA:  Initial evaluation for acute visual loss, severe headache. Abnormal brain MRI earlier today. EXAM: MRI HEAD/ORBITS WITHOUT AND WITH CONTRAST MRI CERVICAL SPINE WITHOUT AND WITH CONTRAST TECHNIQUE: Multiplanar, multiecho pulse sequences of the brain and surrounding structures, and cervical spine, to include the craniocervical junction and cervicothoracic junction, were obtained without and with intravenous contrast. CONTRAST:  13mL MULTIHANCE GADOBENATE DIMEGLUMINE 529 MG/ML IV SOLN COMPARISON:  Previous MRI from earlier the same day. FINDINGS: MRI HEAD  FINDINGS Brain: Cerebral volume within normal limits. Approximate 5 total subcentimeter T2/FLAIR hyperintense foci again noted involving the supratentorial cerebral white matter, mild in nature, but abnormal for age. Only 1 of these is periventricular in location (series 14, image 18). No associated diffusion abnormality or enhancement identified. No foci seen involving the brainstem or cerebellum. No other focal parenchymal signal abnormality. No evidence for acute infarct. Gray-white matter differentiation maintained. No foci of susceptibility artifact to suggest acute or chronic intracranial hemorrhage. No mass lesion, midline shift or mass effect. No hydrocephalus. No extra-axial fluid collection. Major dural sinuses grossly patent. No abnormal enhancement within the brain itself. Pituitary gland within normal limits. Suprasellar region normal. Midline structures intact and normal. Vascular: Major intracranial vascular flow voids are well maintained. Skull and upper cervical spine: Craniocervical junction within normal limits. Bone marrow signal intensity normal. No scalp soft tissue abnormality. Other: No mastoid effusion.  Inner ear structures normal. MRI ORBIT FINDINGS Orbits: Globes are symmetric in size with normal morphology and appearance bilaterally. Right optic nerve is enlarged as compared to the left with associated intra neural edema and restricted diffusion. Optic discs seen bulging at the posterior aspect of the globe. Avid postcontrast enhancement seen throughout the majority of the intraorbital portion of the nerve, consistent with acute optic neuritis. Surrounding perineural inflammatory stranding. Relative sparing of the pre chiasmatic segment. Optic chiasm itself is normal in appearance. Left optic nerve within normal limits without edema or enhancement. Remainder of the intraorbital contents including the extraocular muscles, superior orbital veins, and lacrimal glands are normal in  appearance. Sinuses: Visualized paranasal sinuses are clear. Soft tissues: Periorbital soft tissues within normal limits. MRI CERVICAL SPINE FINDINGS Alignment: Straightening of the normal cervical lordosis. No listhesis. Vertebrae: Vertebral body heights maintained without evidence for acute or chronic fracture. Bone marrow signal intensity within normal limits. No discrete or worrisome osseous lesions. No abnormal marrow edema or enhancement. Cord: Signal intensity within the cervical spinal cord is normal. No cord signal abnormality to suggest demyelinating disease. No abnormal cord expansion or edema. No abnormal enhancement. Posterior Fossa, vertebral arteries, paraspinal tissues: Paraspinous and prevertebral soft tissues within normal limits. Normal intravascular flow voids present within the vertebral arteries bilaterally. Disc levels: No significant disc pathology seen within the cervical spine. No disc bulge or disc protrusion. No significant canal or foraminal stenosis. IMPRESSION: MRI BRAIN IMPRESSION 1. Approximate 5 total T2/FLAIR signal abnormality involving the supratentorial cerebral white matter, nonspecific, but perhaps most concerning for possible demyelinating disease given the right optic nerve findings. No associated diffusion abnormality or enhancement to suggest active demyelination. Differential considerations include sequelae of complicated migraine, vasculitis, or other chronic infectious or inflammatory process. 2. Otherwise normal brain MRI. MRI ORBIT IMPRESSION 1. Right optic nerve enlargement, edema, restricted diffusion, and enhancement. Findings consistent with acute optic neuritis. 2. Otherwise normal MRI of the orbits. MRI CERVICAL SPINE IMPRESSION Normal MRI of the cervical spinal cord. No cord signal abnormality to suggest demyelinating disease or other acute abnormality. No significant disc pathology or stenosis. Electronically Signed   By: Rise Mu M.D.   On:  08/19/2017 23:38   Mr Cervical Spine W Or Wo Contrast  Result Date: 08/19/2017 CLINICAL DATA:  Initial evaluation for acute visual loss, severe headache. Abnormal brain MRI earlier today. EXAM: MRI HEAD/ORBITS WITHOUT AND WITH CONTRAST MRI CERVICAL SPINE WITHOUT AND WITH CONTRAST TECHNIQUE: Multiplanar, multiecho pulse sequences of the brain and surrounding structures, and cervical spine, to include the craniocervical junction and cervicothoracic junction, were obtained without and with intravenous contrast. CONTRAST:  13mL MULTIHANCE GADOBENATE DIMEGLUMINE 529 MG/ML IV SOLN COMPARISON:  Previous MRI from earlier the same day. FINDINGS: MRI HEAD FINDINGS Brain: Cerebral volume within normal limits. Approximate 5 total subcentimeter T2/FLAIR hyperintense foci again noted involving the supratentorial cerebral white matter, mild in nature, but abnormal for age. Only 1 of these is periventricular in location (series 14, image 18). No associated diffusion abnormality or enhancement identified. No foci seen involving the brainstem or cerebellum. No other focal parenchymal signal abnormality. No evidence for acute infarct. Gray-white matter differentiation maintained. No foci of susceptibility artifact to suggest acute or chronic intracranial hemorrhage. No mass lesion, midline shift or mass effect. No hydrocephalus. No extra-axial fluid collection. Major dural sinuses grossly patent. No abnormal enhancement within the brain itself. Pituitary gland within normal limits. Suprasellar region normal. Midline structures intact and normal. Vascular: Major intracranial vascular flow voids are well maintained. Skull and upper cervical spine: Craniocervical junction within normal limits. Bone marrow signal intensity normal. No scalp soft tissue abnormality. Other: No mastoid effusion.  Inner ear structures normal. MRI ORBIT FINDINGS Orbits: Globes are symmetric in size with normal morphology and appearance bilaterally. Right  optic nerve is enlarged as compared to the left with associated intra neural edema and restricted diffusion. Optic discs seen bulging at the posterior aspect of the globe. Avid postcontrast enhancement seen throughout the majority of the intraorbital portion of the nerve, consistent with acute optic neuritis. Surrounding perineural inflammatory stranding. Relative sparing of the pre chiasmatic segment. Optic chiasm itself is normal in  appearance. Left optic nerve within normal limits without edema or enhancement. Remainder of the intraorbital contents including the extraocular muscles, superior orbital veins, and lacrimal glands are normal in appearance. Sinuses: Visualized paranasal sinuses are clear. Soft tissues: Periorbital soft tissues within normal limits. MRI CERVICAL SPINE FINDINGS Alignment: Straightening of the normal cervical lordosis. No listhesis. Vertebrae: Vertebral body heights maintained without evidence for acute or chronic fracture. Bone marrow signal intensity within normal limits. No discrete or worrisome osseous lesions. No abnormal marrow edema or enhancement. Cord: Signal intensity within the cervical spinal cord is normal. No cord signal abnormality to suggest demyelinating disease. No abnormal cord expansion or edema. No abnormal enhancement. Posterior Fossa, vertebral arteries, paraspinal tissues: Paraspinous and prevertebral soft tissues within normal limits. Normal intravascular flow voids present within the vertebral arteries bilaterally. Disc levels: No significant disc pathology seen within the cervical spine. No disc bulge or disc protrusion. No significant canal or foraminal stenosis. IMPRESSION: MRI BRAIN IMPRESSION 1. Approximate 5 total T2/FLAIR signal abnormality involving the supratentorial cerebral white matter, nonspecific, but perhaps most concerning for possible demyelinating disease given the right optic nerve findings. No associated diffusion abnormality or enhancement to  suggest active demyelination. Differential considerations include sequelae of complicated migraine, vasculitis, or other chronic infectious or inflammatory process. 2. Otherwise normal brain MRI. MRI ORBIT IMPRESSION 1. Right optic nerve enlargement, edema, restricted diffusion, and enhancement. Findings consistent with acute optic neuritis. 2. Otherwise normal MRI of the orbits. MRI CERVICAL SPINE IMPRESSION Normal MRI of the cervical spinal cord. No cord signal abnormality to suggest demyelinating disease or other acute abnormality. No significant disc pathology or stenosis. Electronically Signed   By: Rise Mu M.D.   On: 08/19/2017 23:38   Mr Rockwell [place] YQ Contrast  Result Date: 08/19/2017 CLINICAL DATA:  Initial evaluation for acute visual loss, severe headache. Abnormal brain MRI earlier today. EXAM: MRI HEAD/ORBITS WITHOUT AND WITH CONTRAST MRI CERVICAL SPINE WITHOUT AND WITH CONTRAST TECHNIQUE: Multiplanar, multiecho pulse sequences of the brain and surrounding structures, and cervical spine, to include the craniocervical junction and cervicothoracic junction, were obtained without and with intravenous contrast. CONTRAST:  13mL MULTIHANCE GADOBENATE DIMEGLUMINE 529 MG/ML IV SOLN COMPARISON:  Previous MRI from earlier the same day. FINDINGS: MRI HEAD FINDINGS Brain: Cerebral volume within normal limits. Approximate 5 total subcentimeter T2/FLAIR hyperintense foci again noted involving the supratentorial cerebral white matter, mild in nature, but abnormal for age. Only 1 of these is periventricular in location (series 14, image 18). No associated diffusion abnormality or enhancement identified. No foci seen involving the brainstem or cerebellum. No other focal parenchymal signal abnormality. No evidence for acute infarct. Gray-white matter differentiation maintained. No foci of susceptibility artifact to suggest acute or chronic intracranial hemorrhage. No mass lesion, midline shift or mass effect.  No hydrocephalus. No extra-axial fluid collection. Major dural sinuses grossly patent. No abnormal enhancement within the brain itself. Pituitary gland within normal limits. Suprasellar region normal. Midline structures intact and normal. Vascular: Major intracranial vascular flow voids are well maintained. Skull and upper cervical spine: Craniocervical junction within normal limits. Bone marrow signal intensity normal. No scalp soft tissue abnormality. Other: No mastoid effusion.  Inner ear structures normal. MRI ORBIT FINDINGS Orbits: Globes are symmetric in size with normal morphology and appearance bilaterally. Right optic nerve is enlarged as compared to the left with associated intra neural edema and restricted diffusion. Optic discs seen bulging at the posterior aspect of the globe. Avid postcontrast enhancement seen throughout the majority of the  intraorbital portion of the nerve, consistent with acute optic neuritis. Surrounding perineural inflammatory stranding. Relative sparing of the pre chiasmatic segment. Optic chiasm itself is normal in appearance. Left optic nerve within normal limits without edema or enhancement. Remainder of the intraorbital contents including the extraocular muscles, superior orbital veins, and lacrimal glands are normal in appearance. Sinuses: Visualized paranasal sinuses are clear. Soft tissues: Periorbital soft tissues within normal limits. MRI CERVICAL SPINE FINDINGS Alignment: Straightening of the normal cervical lordosis. No listhesis. Vertebrae: Vertebral body heights maintained without evidence for acute or chronic fracture. Bone marrow signal intensity within normal limits. No discrete or worrisome osseous lesions. No abnormal marrow edema or enhancement. Cord: Signal intensity within the cervical spinal cord is normal. No cord signal abnormality to suggest demyelinating disease. No abnormal cord expansion or edema. No abnormal enhancement. Posterior Fossa, vertebral  arteries, paraspinal tissues: Paraspinous and prevertebral soft tissues within normal limits. Normal intravascular flow voids present within the vertebral arteries bilaterally. Disc levels: No significant disc pathology seen within the cervical spine. No disc bulge or disc protrusion. No significant canal or foraminal stenosis. IMPRESSION: MRI BRAIN IMPRESSION 1. Approximate 5 total T2/FLAIR signal abnormality involving the supratentorial cerebral white matter, nonspecific, but perhaps most concerning for possible demyelinating disease given the right optic nerve findings. No associated diffusion abnormality or enhancement to suggest active demyelination. Differential considerations include sequelae of complicated migraine, vasculitis, or other chronic infectious or inflammatory process. 2. Otherwise normal brain MRI. MRI ORBIT IMPRESSION 1. Right optic nerve enlargement, edema, restricted diffusion, and enhancement. Findings consistent with acute optic neuritis. 2. Otherwise normal MRI of the orbits. MRI CERVICAL SPINE IMPRESSION Normal MRI of the cervical spinal cord. No cord signal abnormality to suggest demyelinating disease or other acute abnormality. No significant disc pathology or stenosis. Electronically Signed   By: Rise Mu M.D.   On: 08/19/2017 23:38    Scheduled Meds: . enoxaparin (LOVENOX) injection  40 mg Subcutaneous Q24H  . pantoprazole  40 mg Oral Q0600  . sodium chloride flush  3 mL Intravenous Q12H   Continuous Infusions: . sodium chloride    . methylPREDNISolone (SOLU-MEDROL) injection Stopped (08/20/17 1240)     LOS: 1 day   Time spent: 25 minutes.  Tyrone Nine, MD [facility] Pager [phone]  If 7PM-7AM, please contact night-coverage [email or web address] Password [email or web address] 08/20/2017, 1:10 PM

## 2017-08-20 NOTE — Progress Notes (Addendum)
Subjective: Patient in bed continues to complain of right-sided headache which she states has improved from yesterday, as well as no vision on the right eye, states " All I can see is black" .  Admits to right ocular pain with movement of eye to the right side but during the assessment noted that symptoms were improving.  Patient at this time has not received her Solu-Medrol IV dose.  She has no further complaints of tingling, left ocular pain or vision loss, chest pain shortness of breath.  Exam: Vitals:   08/20/17 0302 08/20/17 0852  BP: 115/69 120/76  Pulse: (!) 56 67  Resp: 18 18  Temp: 97.9 F (36.6 C) 98.3 F (36.8 C)  SpO2: 98% 100%    Physical Exam  HEENT-  Normocephalic, no lesions, without obvious abnormality.  Normal external eye and conjunctiva.   Cardiovascular- S1-S2 audible, pulses palpable throughout   Lungs-no rhonchi or wheezing noted, no excessive working breathing.  Saturations within normal limits Abdomen- All 4 quadrants palpated and nontender Musculoskeletal-no joint tenderness, deformity or swelling Skin-warm and dry  Neuro:  Mental Status: Alert, oriented, thought content appropriate.  Speech fluent without evidence of aphasia.  Able to follow 3 step commands without difficulty. Cranial Nerves: II: No vision on right eye, per patient, able to read or see Snellen card with  right eye III,IV, VI: ptosis not present, pupils with conjugate  extra-ocular motions intact bilaterally.  Left  pupils equal, round, reactive to light,  Right pupil more dilated and sluggish in response, no withdraw to threat on right eye V,VII: smile symmetric, facial light touch sensation normal bilaterally VIII: hearing intact to voice IX,X: uvula rises symmetrically XI: bilateral shoulder shrug XII: midline tongue extension Motor: Right : Upper extremity   5/5    Left:     Upper extremity   5/5  Lower extremity   5/5     Lower extremity   5/5 Tone and bulk:normal tone throughout;  no atrophy noted Sensory: Pinprick and light touch intact throughout, bilaterally Deep Tendon Reflexes: 2+ and symmetric throughout Plantars: Right: downgoing   Left: downgoing   Medications:  . enoxaparin (LOVENOX) injection  40 mg Subcutaneous Q24H  . pantoprazole  40 mg Oral Q0600  . sodium chloride flush  3 mL Intravenous Q12H    Pertinent Labs/Diagnostics: Ct Head Wo Contrast  08/19/2017 IMPRESSION: Normal study.   Mr Brain Wo Contrast  08/19/2017 IMPRESSION:  RIGHT optic nerve enlargement, T2 hyperintensity, and restricted diffusion consistent with optic neuritis. Small supratentorial white matter lesions, atypical for demyelinating disease; only one is periventricular.    MRI BRAIN 08/19/17 IMPRESSION  1. Approximate 5 total T2/FLAIR signal abnormality involving the supratentorial cerebral white matter, nonspecific, but perhaps most concerning for possible demyelinating disease given the right optic nerve findings. No associated diffusion abnormality or enhancement to suggest active demyelination. Differential considerations include sequelae of complicated migraine, vasculitis, or other chronic infectious or inflammatory process. 2. Otherwise normal brain MRI.   MRI ORBIT 08/19/17  IMPRESSION  1. Right optic nerve enlargement, edema, restricted diffusion, and enhancement. Findings consistent with acute optic neuritis.  2. Otherwise normal MRI of the orbits.   MRI CERVICAL SPINE  08/19/2017 IMPRESSION  Normal MRI of the cervical spinal cord. No cord signal abnormality to suggest demyelinating disease or other acute abnormality. No significant disc pathology or stenosis.   Assessment: Jennifer [name] is a 27 y.o. female with no past medical history, who was sent over by her ophthalmologist for  evaluation of possible optic neuritis.  Patient had complaints of progressively worsening right sided headache and painful right vision loss that started about a week ago.  MRI without contrast  done on admission to the ER showed restricted diffusion in the right optic nerve.  MRI of the orbits showed findings consistent with acute optic neuritis.  Neuro was consulted for evaluation.  MRI brain as well as the cervical spine did not show findings suggesting demyelinating disease or acute abnormalities  1.  Right retrobulbar optic neuritis-confirmed by MRI orbits with and without contrast.  Patient is scheduled to have Solu-Medrol 1000 mg IV times the next 5 days.  At time of assessment patient has not received a dose yet, she continues to complain of complete vision loss on the right side, but improvement in the headache on the right and improvement in associated with ocular movements. Discussed family history extensively and patient has a family member with MS as well as lupus.  Recommendations: - Continue Solu-Medrol IV 1 g x 5 days. first dose to be given today - Protonix for GI prophylaxis -Awaiting neuromyelitis optica antibodies results - Outpatient follow-up with Dr. Epimenio Foot at [facility] neurology    Noralee Space DNP Neuro-hospitalist Team [phone] 08/20/2017, 11:35 AM  I have seen the patient and reviewed the above note.  She just received her IV steroids when I was in the room, we will continue to follow  Ritta Slot, MD [facility] [phone]  If 7pm- 7am, please page neurology on call as listed in AMION.

## 2017-08-20 NOTE — Progress Notes (Signed)
Patient wanting neurologist to speak to parents in room. I paged Dr Wilford Corner, and informed him of this. Will make patient aware that I text paged the MD.

## 2017-08-21 ENCOUNTER — Encounter (HOSPITAL_COMMUNITY): Payer: Self-pay | Admitting: *Deleted

## 2017-08-21 LAB — GLUCOSE, CAPILLARY: Glucose-Capillary: 152 mg/dL — ABNORMAL HIGH (ref 65–99)

## 2017-08-21 MED ORDER — ONDANSETRON 4 MG PO TBDP
8.0000 mg | ORAL_TABLET | Freq: Three times a day (TID) | ORAL | Status: DC | PRN
  Filled 2017-08-21: qty 2

## 2017-08-21 NOTE — Progress Notes (Signed)
PROGRESS NOTE  Jennifer [name]  ZOX:[id number] DOB: 23-Apr-1990 DOA: 08/19/2017 PCP: None Brief Narrative: Jennifer [name] is a previously healthy 27 y.o. female tobacco user who presented with one week of progressive right sided pounding headache associated with diminished blurry, then darkening, vision. She had been referred to ophthalmology who was concerned for optic neuritis, sent to the ED for further work up where an MRI at [facility] showed restricted diffusion in the right optic nerve. Neurology was consulted, recommending MRI of cervical spine due to some transitory numbness in bilateral upper extremities worse with heat, and of the orbits. This showed no lesions in the C-spine and a few FLAIR hyperintensities with one suspicious for MS-like lesion. IV steroids were recommended and she was admitted to [facility].  Assessment & Plan: Right optic neuritis: Total vision loss currently.  - Solumedrol 1g IV daily x5 days (5/4 - 5/8) (w/PPI GI ppx).  - Pain control with oxycodone, improved with dosing 5-10mg  prn mod-severe pain.  - OT evaluation pending - Neurology continuing to follow. Will need outpatient follow up with Dr. Epimenio [name] at [facility].  - Neuromyelitis optice auto-Ab is pending.  Tobacco use: 1/4 - 1/2ppd, refusing nicotine patch.  - Will benefit from cessation counseling prior to discharge.   DVT prophylaxis: Lovenox Code Status: Full Family Communication: Husband at bedside Disposition Plan: Home after IV steroids x5 days. Patient may leave the unit for short amounts of time contingent on the consent of nursing. Consultants: Neurology Procedures: None  Antimicrobials:  None   Subjective: No return of right eye vision. Right sided headache is improved with pain medications. Got lightheaded after steroid yesterday but remained with steady gait, no dizziness.  Objective: BP 114/75 (BP Location: Left Arm)   Pulse 68   Temp 98.6 F (37 C) (Oral)   Resp 18   Ht 5\' 1"  (1.549 m)   Wt 65.8 kg  (145 lb)   LMP 08/05/2017   SpO2 96%   BMI 27.40 kg/m   Gen: 27 y.o. female in no distress Pulm: Non-labored, clear CV: RRR, no murmur. No JVD or edema.  Neuro: Alert and oriented. No light perception on right, right pupil not directly or consensually reactive. Left pupil only directly reactive. CNs otherwise intact. Eye appears normal. Gait is narrow-based.  CBC: Recent Labs  Lab 08/19/17 1009 08/20/17 0056  WBC 5.1 7.5  NEUTROABS 2.9  --   HGB 14.2 14.4  HCT 44.3 43.8  MCV 86.7 86.1  PLT 181 181   Basic Metabolic Panel: Recent Labs  Lab 08/19/17 1009 08/20/17 0056  NA 138  --   K 4.2  --   CL 104  --   CO2 24  --   GLUCOSE 95  --   BUN 8  --   CREATININE 0.79 0.87  CALCIUM 9.3  --    Radiology Studies: Mr Laqueta Jean Wo Contrast  Result Date: 08/19/2017 CLINICAL DATA:  Initial evaluation for acute visual loss, severe headache. Abnormal brain MRI earlier today. EXAM: MRI HEAD/ORBITS WITHOUT AND WITH CONTRAST MRI CERVICAL SPINE WITHOUT AND WITH CONTRAST TECHNIQUE: Multiplanar, multiecho pulse sequences of the brain and surrounding structures, and cervical spine, to include the craniocervical junction and cervicothoracic junction, were obtained without and with intravenous contrast. CONTRAST:  13mL MULTIHANCE GADOBENATE DIMEGLUMINE 529 MG/ML IV SOLN COMPARISON:  Previous MRI from earlier the same day. FINDINGS: MRI HEAD FINDINGS Brain: Cerebral volume within normal limits. Approximate 5 total subcentimeter T2/FLAIR hyperintense foci again noted involving the supratentorial  cerebral white matter, mild in nature, but abnormal for age. Only 1 of these is periventricular in location (series 14, image 18). No associated diffusion abnormality or enhancement identified. No foci seen involving the brainstem or cerebellum. No other focal parenchymal signal abnormality. No evidence for acute infarct. Gray-white matter differentiation maintained. No foci of susceptibility artifact to suggest  acute or chronic intracranial hemorrhage. No mass lesion, midline shift or mass effect. No hydrocephalus. No extra-axial fluid collection. Major dural sinuses grossly patent. No abnormal enhancement within the brain itself. Pituitary gland within normal limits. Suprasellar region normal. Midline structures intact and normal. Vascular: Major intracranial vascular flow voids are well maintained. Skull and upper cervical spine: Craniocervical junction within normal limits. Bone marrow signal intensity normal. No scalp soft tissue abnormality. Other: No mastoid effusion.  Inner ear structures normal. MRI ORBIT FINDINGS Orbits: Globes are symmetric in size with normal morphology and appearance bilaterally. Right optic nerve is enlarged as compared to the left with associated intra neural edema and restricted diffusion. Optic discs seen bulging at the posterior aspect of the globe. Avid postcontrast enhancement seen throughout the majority of the intraorbital portion of the nerve, consistent with acute optic neuritis. Surrounding perineural inflammatory stranding. Relative sparing of the pre chiasmatic segment. Optic chiasm itself is normal in appearance. Left optic nerve within normal limits without edema or enhancement. Remainder of the intraorbital contents including the extraocular muscles, superior orbital veins, and lacrimal glands are normal in appearance. Sinuses: Visualized paranasal sinuses are clear. Soft tissues: Periorbital soft tissues within normal limits. MRI CERVICAL SPINE FINDINGS Alignment: Straightening of the normal cervical lordosis. No listhesis. Vertebrae: Vertebral body heights maintained without evidence for acute or chronic fracture. Bone marrow signal intensity within normal limits. No discrete or worrisome osseous lesions. No abnormal marrow edema or enhancement. Cord: Signal intensity within the cervical spinal cord is normal. No cord signal abnormality to suggest demyelinating disease. No  abnormal cord expansion or edema. No abnormal enhancement. Posterior Fossa, vertebral arteries, paraspinal tissues: Paraspinous and prevertebral soft tissues within normal limits. Normal intravascular flow voids present within the vertebral arteries bilaterally. Disc levels: No significant disc pathology seen within the cervical spine. No disc bulge or disc protrusion. No significant canal or foraminal stenosis. IMPRESSION: MRI BRAIN IMPRESSION 1. Approximate 5 total T2/FLAIR signal abnormality involving the supratentorial cerebral white matter, nonspecific, but perhaps most concerning for possible demyelinating disease given the right optic nerve findings. No associated diffusion abnormality or enhancement to suggest active demyelination. Differential considerations include sequelae of complicated migraine, vasculitis, or other chronic infectious or inflammatory process. 2. Otherwise normal brain MRI. MRI ORBIT IMPRESSION 1. Right optic nerve enlargement, edema, restricted diffusion, and enhancement. Findings consistent with acute optic neuritis. 2. Otherwise normal MRI of the orbits. MRI CERVICAL SPINE IMPRESSION Normal MRI of the cervical spinal cord. No cord signal abnormality to suggest demyelinating disease or other acute abnormality. No significant disc pathology or stenosis. Electronically Signed   By: Rise Mu M.D.   On: 08/19/2017 23:38   Mr Cervical Spine W Or Wo Contrast  Result Date: 08/19/2017 CLINICAL DATA:  Initial evaluation for acute visual loss, severe headache. Abnormal brain MRI earlier today. EXAM: MRI HEAD/ORBITS WITHOUT AND WITH CONTRAST MRI CERVICAL SPINE WITHOUT AND WITH CONTRAST TECHNIQUE: Multiplanar, multiecho pulse sequences of the brain and surrounding structures, and cervical spine, to include the craniocervical junction and cervicothoracic junction, were obtained without and with intravenous contrast. CONTRAST:  13mL MULTIHANCE GADOBENATE DIMEGLUMINE 529 MG/ML IV SOLN  COMPARISON:  Previous MRI from earlier the same day. FINDINGS: MRI HEAD FINDINGS Brain: Cerebral volume within normal limits. Approximate 5 total subcentimeter T2/FLAIR hyperintense foci again noted involving the supratentorial cerebral white matter, mild in nature, but abnormal for age. Only 1 of these is periventricular in location (series 14, image 18). No associated diffusion abnormality or enhancement identified. No foci seen involving the brainstem or cerebellum. No other focal parenchymal signal abnormality. No evidence for acute infarct. Gray-white matter differentiation maintained. No foci of susceptibility artifact to suggest acute or chronic intracranial hemorrhage. No mass lesion, midline shift or mass effect. No hydrocephalus. No extra-axial fluid collection. Major dural sinuses grossly patent. No abnormal enhancement within the brain itself. Pituitary gland within normal limits. Suprasellar region normal. Midline structures intact and normal. Vascular: Major intracranial vascular flow voids are well maintained. Skull and upper cervical spine: Craniocervical junction within normal limits. Bone marrow signal intensity normal. No scalp soft tissue abnormality. Other: No mastoid effusion.  Inner ear structures normal. MRI ORBIT FINDINGS Orbits: Globes are symmetric in size with normal morphology and appearance bilaterally. Right optic nerve is enlarged as compared to the left with associated intra neural edema and restricted diffusion. Optic discs seen bulging at the posterior aspect of the globe. Avid postcontrast enhancement seen throughout the majority of the intraorbital portion of the nerve, consistent with acute optic neuritis. Surrounding perineural inflammatory stranding. Relative sparing of the pre chiasmatic segment. Optic chiasm itself is normal in appearance. Left optic nerve within normal limits without edema or enhancement. Remainder of the intraorbital contents including the extraocular  muscles, superior orbital veins, and lacrimal glands are normal in appearance. Sinuses: Visualized paranasal sinuses are clear. Soft tissues: Periorbital soft tissues within normal limits. MRI CERVICAL SPINE FINDINGS Alignment: Straightening of the normal cervical lordosis. No listhesis. Vertebrae: Vertebral body heights maintained without evidence for acute or chronic fracture. Bone marrow signal intensity within normal limits. No discrete or worrisome osseous lesions. No abnormal marrow edema or enhancement. Cord: Signal intensity within the cervical spinal cord is normal. No cord signal abnormality to suggest demyelinating disease. No abnormal cord expansion or edema. No abnormal enhancement. Posterior Fossa, vertebral arteries, paraspinal tissues: Paraspinous and prevertebral soft tissues within normal limits. Normal intravascular flow voids present within the vertebral arteries bilaterally. Disc levels: No significant disc pathology seen within the cervical spine. No disc bulge or disc protrusion. No significant canal or foraminal stenosis. IMPRESSION: MRI BRAIN IMPRESSION 1. Approximate 5 total T2/FLAIR signal abnormality involving the supratentorial cerebral white matter, nonspecific, but perhaps most concerning for possible demyelinating disease given the right optic nerve findings. No associated diffusion abnormality or enhancement to suggest active demyelination. Differential considerations include sequelae of complicated migraine, vasculitis, or other chronic infectious or inflammatory process. 2. Otherwise normal brain MRI. MRI ORBIT IMPRESSION 1. Right optic nerve enlargement, edema, restricted diffusion, and enhancement. Findings consistent with acute optic neuritis. 2. Otherwise normal MRI of the orbits. MRI CERVICAL SPINE IMPRESSION Normal MRI of the cervical spinal cord. No cord signal abnormality to suggest demyelinating disease or other acute abnormality. No significant disc pathology or stenosis.  Electronically Signed   By: Rise Mu M.D.   On: 08/19/2017 23:38   Mr Rockwell [place] ZO Contrast  Result Date: 08/19/2017 CLINICAL DATA:  Initial evaluation for acute visual loss, severe headache. Abnormal brain MRI earlier today. EXAM: MRI HEAD/ORBITS WITHOUT AND WITH CONTRAST MRI CERVICAL SPINE WITHOUT AND WITH CONTRAST TECHNIQUE: Multiplanar, multiecho pulse sequences of the brain and surrounding structures, and cervical spine,  to include the craniocervical junction and cervicothoracic junction, were obtained without and with intravenous contrast. CONTRAST:  9mL MULTIHANCE GADOBENATE DIMEGLUMINE 529 MG/ML IV SOLN COMPARISON:  Previous MRI from earlier the same day. FINDINGS: MRI HEAD FINDINGS Brain: Cerebral volume within normal limits. Approximate 5 total subcentimeter T2/FLAIR hyperintense foci again noted involving the supratentorial cerebral white matter, mild in nature, but abnormal for age. Only 1 of these is periventricular in location (series 14, image 18). No associated diffusion abnormality or enhancement identified. No foci seen involving the brainstem or cerebellum. No other focal parenchymal signal abnormality. No evidence for acute infarct. Gray-white matter differentiation maintained. No foci of susceptibility artifact to suggest acute or chronic intracranial hemorrhage. No mass lesion, midline shift or mass effect. No hydrocephalus. No extra-axial fluid collection. Major dural sinuses grossly patent. No abnormal enhancement within the brain itself. Pituitary gland within normal limits. Suprasellar region normal. Midline structures intact and normal. Vascular: Major intracranial vascular flow voids are well maintained. Skull and upper cervical spine: Craniocervical junction within normal limits. Bone marrow signal intensity normal. No scalp soft tissue abnormality. Other: No mastoid effusion.  Inner ear structures normal. MRI ORBIT FINDINGS Orbits: Globes are symmetric in size with normal  morphology and appearance bilaterally. Right optic nerve is enlarged as compared to the left with associated intra neural edema and restricted diffusion. Optic discs seen bulging at the posterior aspect of the globe. Avid postcontrast enhancement seen throughout the majority of the intraorbital portion of the nerve, consistent with acute optic neuritis. Surrounding perineural inflammatory stranding. Relative sparing of the pre chiasmatic segment. Optic chiasm itself is normal in appearance. Left optic nerve within normal limits without edema or enhancement. Remainder of the intraorbital contents including the extraocular muscles, superior orbital veins, and lacrimal glands are normal in appearance. Sinuses: Visualized paranasal sinuses are clear. Soft tissues: Periorbital soft tissues within normal limits. MRI CERVICAL SPINE FINDINGS Alignment: Straightening of the normal cervical lordosis. No listhesis. Vertebrae: Vertebral body heights maintained without evidence for acute or chronic fracture. Bone marrow signal intensity within normal limits. No discrete or worrisome osseous lesions. No abnormal marrow edema or enhancement. Cord: Signal intensity within the cervical spinal cord is normal. No cord signal abnormality to suggest demyelinating disease. No abnormal cord expansion or edema. No abnormal enhancement. Posterior Fossa, vertebral arteries, paraspinal tissues: Paraspinous and prevertebral soft tissues within normal limits. Normal intravascular flow voids present within the vertebral arteries bilaterally. Disc levels: No significant disc pathology seen within the cervical spine. No disc bulge or disc protrusion. No significant canal or foraminal stenosis. IMPRESSION: MRI BRAIN IMPRESSION 1. Approximate 5 total T2/FLAIR signal abnormality involving the supratentorial cerebral white matter, nonspecific, but perhaps most concerning for possible demyelinating disease given the right optic nerve findings. No  associated diffusion abnormality or enhancement to suggest active demyelination. Differential considerations include sequelae of complicated migraine, vasculitis, or other chronic infectious or inflammatory process. 2. Otherwise normal brain MRI. MRI ORBIT IMPRESSION 1. Right optic nerve enlargement, edema, restricted diffusion, and enhancement. Findings consistent with acute optic neuritis. 2. Otherwise normal MRI of the orbits. MRI CERVICAL SPINE IMPRESSION Normal MRI of the cervical spinal cord. No cord signal abnormality to suggest demyelinating disease or other acute abnormality. No significant disc pathology or stenosis. Electronically Signed   By: Rise Mu M.D.   On: 08/19/2017 23:38    Scheduled Meds: . enoxaparin (LOVENOX) injection  40 mg Subcutaneous Q24H  . pantoprazole  40 mg Oral Q0600  . sodium chloride flush  3 mL Intravenous Q12H   Continuous Infusions: . sodium chloride    . methylPREDNISolone (SOLU-MEDROL) injection Stopped (08/21/17 1154)     LOS: 2 days   Time spent: 25 minutes.  Tyrone Nine, MD [facility] Pager [phone]  If 7PM-7AM, please contact night-coverage [email or web address] Password [email or web address] 08/21/2017, 12:20 PM

## 2017-08-21 NOTE — Progress Notes (Signed)
Occupational Therapy Evaluation Patient Details Name: Jennifer [name] MRN: [id number] DOB: [birth year] Today's Date: 08/21/2017    History of Present Illness 26 y.o. female with no past medical history, who was sent over by her ophthalmologist for evaluation of possible optic neuritis. MRI of the orbits showed findings consistent with acute optic neuritis.     Clinical Impression   PTA, pt was independent with ADL and mobility and a full time student. Pt demonstrates loss of vision in her R eye, impacting her depth perception and ability to complete IADL tasks. Recommend pt follow up with OT at the neuro outpt center. Pt asking questions about potential for employment, driving and how she is going to be independent with impaired vision. Will provide pt with the contact information for [facility] who can assist with visual aids as needed and establish a support system for patient regarding her performance in school and assist with her concerns regarding her employability. Pt very appreciative.  Will follow acutely to begin education on compensatory strategies for impaired vision and depth perception to facilitae safe DC home with family.     Follow Up Recommendations  Outpatient OT;Supervision - Intermittent ([facility])    Equipment Recommendations  None recommended by OT    Recommendations for Other Services       Precautions / Restrictions Precautions Precautions: Fall Precaution Comments: impaired depth perception      Mobility Bed Mobility Overal bed mobility: Independent                Transfers Overall transfer level: Independent                    Balance Overall balance assessment: Mild deficits observed, not formally tested(due to depth perception)                                         ADL either performed or assessed with clinical judgement   ADL Overall ADL's : Needs assistance/impaired                                      Functional mobility during ADLs: Supervision/safety General ADL Comments: Pt ableto complete basic ADL tasks. Began discussion on how depth perception in impaired with monocular vision. Educated pt on importance of using her sense of touch to work on functional tasks and prevent falls. Educated on using rail when going  [phone] 5/5/2019up/down steps, using touch when sitting down and pouring liquids. Will benefit from further information     Vision Baseline Vision/History: Wears glasses Wears Glasses: At all times Patient Visual Report: Other (comment)(no vision R eye) Vision Assessment?: Yes Eye Alignment: Within Functional Limits Ocular Range of Motion: Within Functional Limits Alignment/Gaze Preference: Within Defined Limits Tracking/Visual Pursuits: Decreased smoothness of horizontal tracking;Decreased smoothness of vertical tracking Saccades: Additional eye shifts occurred during testing Convergence: Within functional limits Visual Fields: Other (comment)(no vision R eye) Depth Perception: Overshoots Additional Comments: began education on actitivies to work on adapting to impaired depth perception     Perception     Praxis      Pertinent Vitals/Pain Pain Assessment: 0-10 Pain Score: 5  Pain Location: during eye movement Pain Descriptors / Indicators: Discomfort;Grimacing Pain Intervention(s): Limited activity within patient's tolerance     Hand Dominance Right  Extremity/Trunk Assessment Upper Extremity Assessment Upper Extremity Assessment: Overall WFL for tasks assessed   Lower Extremity Assessment Lower Extremity Assessment: Overall WFL for tasks assessed       Communication Communication Communication: No difficulties   Cognition Arousal/Alertness: Awake/alert Behavior During Therapy: WFL for tasks assessed/performed Overall Cognitive Status: Within Functional Limits for tasks assessed                                      General Comments  Began education on compensatory strateiges for low vision with use of light, contrast and reducing clutter.     Exercises     Shoulder Instructions      Home Living Family/patient expects to be discharged to:: Private residence Living Arrangements: Spouse/significant other Available Help at Discharge: Family;Available PRN/intermittently Type of Home: House Home Access: Stairs to enter [employer] of Steps: 4 Entrance Stairs-Rails: Right;Left;Can reach both Home Layout: One level     Bathroom Shower/Tub: IT trainer: Standard Bathroom Accessibility: Yes How Accessible: Accessible via walker Home Equipment: None          Prior Functioning/Environment Level of Independence: Independent        Comments: Criminal Photographer; drove        OT Problem List: Impaired vision/perception;Pain;Decreased safety awareness      OT Treatment/Interventions: Self-care/ADL training;Therapeutic exercise;DME and/or AE instruction;Therapeutic activities;Visual/perceptual remediation/compensation;Patient/family education    OT Goals(Current goals can be found in the care plan section) Acute Rehab OT Goals Patient Stated Goal: to be able to work OT Goal Formulation: With patient Time For Goal Achievement: 09/04/17 Potential to Achieve Goals: Good  OT Frequency: Min 3X/week   Barriers to D/C:            Co-evaluation              AM-PAC PT "6 Clicks" Daily Activity     Outcome Measure Help from another person eating meals?: None Help from another person taking care of personal grooming?: None Help from another person toileting, which includes using toliet, bedpan, or urinal?: None Help from another person bathing (including washing, rinsing, drying)?: None Help from another person to put on and taking off regular upper body clothing?: None Help from another person to put on and taking off  regular lower body clothing?: None 6 Click Score: 24   End of Session Nurse Communication: Mobility status  Activity Tolerance: Patient tolerated treatment well Patient left: in bed;with call bell/phone within reach;with family/visitor present  OT Visit Diagnosis: Low vision, both eyes (H54.2);Pain Pain - Right/Left: Right Pain - part of body: (eye/head)                Time: 5956-3875 OT Time Calculation (min): 22 min Charges:  OT General Charges $OT Visit: 1 Visit OT Evaluation $OT Eval Moderate Complexity: 1 Mod G-Codes:     Jennifer [name], OT/L  [phone] 08/21/2017  Jennifer [name],Jennifer [name] 08/21/2017, 5:13 PM

## 2017-08-21 NOTE — Plan of Care (Signed)
  Problem: Education: Goal: Knowledge of General Education information will improve Outcome: Progressing   Problem: Health Behavior/Discharge Planning: Goal: Ability to manage health-related needs will improve Outcome: Progressing   Problem: Clinical Measurements: Goal: Ability to maintain clinical measurements within normal limits will improve Outcome: Progressing   Problem: Activity: Goal: Risk for activity intolerance will decrease Outcome: Progressing   Problem: Nutrition: Goal: Adequate nutrition will be maintained Outcome: Progressing   Problem: Coping: Goal: Level of anxiety will decrease Outcome: Progressing   Problem: Elimination: Goal: Will not experience complications related to bowel motility Outcome: Progressing Goal: Will not experience complications related to urinary retention Outcome: Progressing   Problem: Pain Managment: Goal: General experience of comfort will improve Outcome: Progressing   Problem: Safety: Goal: Ability to remain free from injury will improve Outcome: Progressing   Problem: Skin Integrity: Goal: Risk for impaired skin integrity will decrease Outcome: Progressing

## 2017-08-21 NOTE — Progress Notes (Addendum)
Subjective: Patient continues to complain of no patient and her right eye, unable to see flashes of light, needs only see black.  Admits to improvement in the right-sided headache with pain meds, admits to improvement ocular pain with movement.  Got lightheaded after steroids yesterday.  Continues to complain of photophobia, denies unsteady gait, dizziness chest pain, shortness of breath.  Exam: Vitals:   08/21/17 1246 08/21/17 1534  BP: 119/79 112/72  Pulse: 99 66  Resp:  16  Temp: 98.8 F (37.1 C) 98.7 F (37.1 C)  SpO2: 95% 98%    Physical Exam  HEENT-  Normocephalic, no lesions, without obvious abnormality.  Normal external eye and conjunctiva.   Cardiovascular- S1-S2 audible, pulses palpable throughout   Lungs-no rhonchi or wheezing noted, no excessive working breathing.  Saturations within normal limits Abdomen- All 4 quadrants palpated and nontender Musculoskeletal-no joint tenderness, deformity or swelling Skin-warm and dry, no hyperpigmentation, vitiligo, or suspicious lesions  Neuro:  Mental Status: Alert, oriented, thought content appropriate.  Speech fluent without evidence of aphasia.  Able to follow 3 step commands without difficulty. Cranial Nerves: II: No vision in the right eye, III,IV, VI: ptosis not present, extra-ocular motions intact bilaterally pupils equal, round, reactive to light.  With afferent pupil defect on the right.  No vision on the right eye, no blink to threat.   V,VII: smile symmetric, facial light touch sensation normal bilaterally VIII: hearing intact to voice IX,X: uvula rises symmetrically Motor: Patient was all extremities equally Tone and bulk:normal tone throughout; no atrophy noted Sensory: Pinprick and light touch intact throughout, bilaterally Deep Tendon Reflexes: 2+ and symmetric throughout Plantars: Right: downgoing   Left: downgoing Cerebellar: No deficits.   Medications:  . enoxaparin (LOVENOX) injection  40 mg Subcutaneous  Q24H  . pantoprazole  40 mg Oral Q0600  . sodium chloride flush  3 mL Intravenous Q12H    Pertinent Labs/Diagnostics:  Assessment: Dietrich N Garlandis a 27 y.o.femalewith no past medical history, who was sent over by her ophthalmologist for evaluation of possible optic neuritis.  Patient had complaints of progressively worsening right sided headache and painful right vision loss that started about a week ago.  MRI without contrast done on admission to the ER showed restricted diffusion in the right optic nerve.  MRI of the orbits showed findings consistent with acute optic neuritis.  Neuro was consulted for evaluation.  MRI brain as well as the cervical spine did not show findings suggesting demyelinating disease or acute abnormalities  1.  Right retrobulbar optic neuritis-confirmed by MRI orbits with and without contrast.  Patient continues to have complete right vision loss, she has received her first dose of Solu-Medrol yesterday as scheduled for 4 more doses.  She does admit to photophobia and improvement in the headache and pain associated with ocular movements.  We had a discussion with patient regarding her current diagnosis and explained that she may not leave the hospital with normal vision or possibly no vision return but will notice some improvement if any over the next 6 months.   Recommendations: - Continue Solu-Medrol IV 1 g x 5 days. first dose  given on 08/20/17 - Protonix for GI prophylaxis - Awaiting neuromyelitis optica antibodies results  Noralee Space DNP Neuro-hospitalist Team [phone] 08/21/2017, 4:33 PM  I have seen the patient reviewed the note.  She has severe right optic neuritis, even with vision loss the severe vision can markedly improve over time.  Continue Solu-Medrol.  Ritta Slot, MD [facility] [phone]  If 7pm- 7am, please page neurology on call as listed in [place].

## 2017-08-22 LAB — GLUCOSE, CAPILLARY: Glucose-Capillary: 111 mg/dL — ABNORMAL HIGH (ref 65–99)

## 2017-08-22 LAB — NEUROMYELITIS OPTICA AUTOAB, IGG: NMO-IgG: 3 U/mL (ref 0.0–3.0)

## 2017-08-22 MED ORDER — MAGNESIUM HYDROXIDE 400 MG/5ML PO SUSP
15.0000 mL | Freq: Every day | ORAL | Status: DC | PRN
  Administered 2017-08-22: 30 mL via ORAL
  Filled 2017-08-22: qty 30

## 2017-08-22 NOTE — Progress Notes (Addendum)
  PROGRESS NOTE  Jennifer [name]  ZOX:[id number] DOB: [birth year] DOA: 08/19/2017 PCP: None Brief Narrative: Jennifer [name] is a previously healthy 27 y.o. female tobacco user who presented with one week of progressive right sided pounding headache associated with diminished blurry, then darkening, vision. She had been referred to ophthalmology who was concerned for optic neuritis, sent to the ED for further work up where an MRI at [facility] showed restricted diffusion in the right optic nerve. Neurology was consulted, recommending MRI of cervical spine due to some transitory numbness in bilateral upper extremities worse with heat, and of the orbits. This showed no lesions in the C-spine and a few FLAIR hyperintensities with one suspicious for MS-like lesion. IV steroids were recommended and she was admitted to [facility].  Assessment & Plan: Right optic neuritis: Total vision loss currently. Retroorbital headache improved. - Solumedrol 1g IV daily x5 days (5/4 - 5/8) (w/PPI GI ppx).  - Pain control with oxycodone, improved with dosing 5-10mg  prn mod-severe pain, though overall diminished need.  - OT evaluation recommending outpatient OT.  - Neurology continuing to follow. Will need outpatient follow up with Dr. Epimenio Foot at [facility].  - Neuromyelitis optice auto-Ab is pending.  Tobacco use: 1/4 - 1/2ppd, refusing nicotine patch.  - Provided in depth cessation counseling today for 5 minutes at bedside with mother who works in healthcare  Constipation: Acute.  - Start milk of magnesia which has helped pt in the past.   DVT prophylaxis: Lovenox Code Status: Full Family Communication: Mother at bedside Disposition Plan: Home after IV steroids x5 days.  Consultants: Neurology Procedures: None  Antimicrobials:  None   Subjective: No sensation of light/shapes in right eye's vision. Left eye normal vision. No lightheadedness or other deficits. Headache generally better but sometimes worst at night. Objective: BP  105/67 (BP Location: Right Arm)   Pulse 63   Temp 98.8 F (37.1 C) (Oral)   Resp 16   Ht 5\' 1"  (1.549 m)   Wt 65.8 kg (145 lb)   LMP 08/05/2017   SpO2 98%   BMI 27.40 kg/m   Gen: 27 y.o. female in no distress Pulm: Non-labored, clear CV: RRR, no murmur. No JVD or edema.  GI: Soft, nontender, nondistended Neuro: Alert and oriented. No light perception on right, right pupil minimally directly/consensually reactive. Left pupil only directly reactive, sluggish consensual response. CNs otherwise intact. Eye appears normal. Gait is narrow-based.  CBC: Recent Labs  Lab 08/19/17 1009 08/20/17 0056  WBC 5.1 7.5  NEUTROABS 2.9  --   HGB 14.2 14.4  HCT 44.3 43.8  MCV 86.7 86.1  PLT 181 181   Basic Metabolic Panel: Recent Labs  Lab 08/19/17 1009 08/20/17 0056  NA 138  --   K 4.2  --   CL 104  --   CO2 24  --   GLUCOSE 95  --   BUN 8  --   CREATININE 0.79 0.87  CALCIUM 9.3  --    Radiology Studies: No results found.  Scheduled Meds: . enoxaparin (LOVENOX) injection  40 mg Subcutaneous Q24H  . pantoprazole  40 mg Oral Q0600  . sodium chloride flush  3 mL Intravenous Q12H   Continuous Infusions: . sodium chloride    . methylPREDNISolone (SOLU-MEDROL) injection Stopped (08/21/17 1154)     LOS: 3 days   Time spent: 15 minutes.  Tyrone Nine, MD [facility] Pager [phone]  If 7PM-7AM, please contact night-coverage [email or web address] Password [email or web address] 08/22/2017, 10:20 AM

## 2017-08-22 NOTE — Progress Notes (Signed)
Occupational Therapy Treatment Patient Details Name: Jennifer [name] MRN: [id number] DOB: May 27, 1990 Today's Date: 08/22/2017    History of present illness 27 y.o. female with no past medical history, who was sent over by her ophthalmologist for evaluation of possible optic neuritis. MRI of the orbits showed findings consistent with acute optic neuritis.     OT comments  Reviewed with pt the safety isseus that may arise with impaired depth perception and visual field impairments Provided education handout for visual filed compensatory strategies for home safety. Provided brochure for contact info for [facility] prn. OT will continue to follow acutely  Follow Up Recommendations  Outpatient OT;Supervision - Intermittent    Equipment Recommendations  None recommended by OT    Recommendations for Other Services      Precautions / Restrictions Precautions Precautions: Fall Precaution Comments: impaired depth perception Restrictions Weight Bearing Restrictions: No       Mobility Bed Mobility Overal bed mobility: Independent                Transfers Overall transfer level: Independent                    Balance Overall balance assessment: Mild deficits observed, not formally tested                                         ADL either performed or assessed with clinical judgement   ADL                                       Functional mobility during ADLs: Supervision/safety General ADL Comments: Reviewed with pt the safety isseus that may arise with impaired depth perception and visual field impairments Provided education handout for visual filed compensatory strategies for home safety. Provided brochure for contact info for [facility] prn     Vision Baseline Vision/History: Wears glasses Wears Glasses: At all times Patient Visual Report: Other (comment)(no vision in R eye, pt decribes as "black") Vision  Assessment?: Yes Ocular Range of Motion: Within Functional Limits Alignment/Gaze Preference: Within Defined Limits Tracking/Visual Pursuits: Decreased smoothness of horizontal tracking;Decreased smoothness of vertical tracking Saccades: Additional eye shifts occurred during testing Convergence: Within functional limits Visual Fields: Other (comment)(no vision on R eye) Depth Perception: Overshoots Additional Comments: Reviewed with pt the safety isseus that may arise with impaired depth perception and visual field impairments Provided education handout for visual field compensatory strategies for home safety. Provided brochure for contact info for [facility] prn   Perception     Praxis      Cognition Arousal/Alertness: Awake/alert Behavior During Therapy: WFL for tasks assessed/performed Overall Cognitive Status: Within Functional Limits for tasks assessed                                          Exercises     Shoulder Instructions       General Comments      Pertinent Vitals/ Pain       Pain Assessment: No/denies pain Pain Score: 3  Pain Location: headache R side Pain Descriptors / Indicators: Aching Pain Intervention(s): Monitored during session  Home Living  Prior Functioning/Environment              Frequency  Min 3X/week        Progress Toward Goals  OT Goals(current goals can now be found in the care plan section)  Progress towards OT goals: Progressing toward goals  Acute Rehab OT Goals Patient Stated Goal: to be able to work ADL Goals Additional ADL Goal #1: pt will verbalize 3 vision compensatory strategies for ADL safety Additional ADL Goal #2: pt will verbalize 3 vision compensatory strategies for IADL/home mgt safety Additional ADL Goal #3: pt will verbalize 3 vision compensatory strategies for safety during mobility  Plan Discharge plan remains appropriate     Co-evaluation                 AM-PAC PT "6 Clicks" Daily Activity     Outcome Measure   Help from another person eating meals?: None Help from another person taking care of personal grooming?: None Help from another person toileting, which includes using toliet, bedpan, or urinal?: None Help from another person bathing (including washing, rinsing, drying)?: None Help from another person to put on and taking off regular upper body clothing?: None Help from another person to put on and taking off regular lower body clothing?: None 6 Click Score: 24    End of Session    OT Visit Diagnosis: Low vision, both eyes (H54.2);Pain Pain - Right/Left: Right   Activity Tolerance Patient tolerated treatment well   Patient Left in bed;with call bell/phone within reach;with family/visitor present   Nurse Communication Mobility status    Functional Assessment Tool Used: AM-PAC 6 Clicks Daily Activity   Time: 4010-2725 OT Time Calculation (min): 24 min  Charges: OT G-codes **NOT FOR INPATIENT CLASS** Functional Assessment Tool Used: AM-PAC 6 Clicks Daily Activity OT General Charges $OT Visit: 1 Visit OT Treatments $Self Care/Home Management : 8-22 mins $Therapeutic Activity: 8-22 mins     Galen Manila 08/22/2017, 2:42 PM

## 2017-08-23 NOTE — Care Management Note (Addendum)
Case Management Note  Patient Details  Name: Jennifer [name] MRN: [id number] Date of Birth: [birth year]  Subjective/Objective:    Optic Neuritis, right eye vision loss                Action/Plan: NCM spoke to pt at bedside. States she has concerns about follow up with PCP and specialist post dc. Provided pt with list of [employer] provider to call and arrange follow up appt. Will fax referral to [facility] for Outpt OT. Pt lives at home with husband. 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   NCM spoke to pt and states she was able to get PCP with Dr Nita Sells on 09/05/2017 at 330 pm.   Expected Discharge Date:                  Expected Discharge Plan:  Home/Self Care  In-House Referral:  NA  Discharge planning Services  CM Consult  Post Acute Care Choice:  NA Choice offered to:  NA  DME Arranged:  N/A DME Agency:  NA  HH Arranged:  NA HH Agency:  NA  Status of Service:  Completed, signed off  If discussed at Long Length of Stay Meetings, dates discussed:    Additional Comments:  Elliot Cousin, RN 08/23/2017, 2:49 PM

## 2017-08-23 NOTE — Progress Notes (Signed)
  PROGRESS NOTE  Jennifer [name]  WUJ:[id number] DOB: 08/30/1990 DOA: 08/19/2017 PCP: None Brief Narrative: Jennifer [name] is a previously healthy 27 y.o. female tobacco user who presented with one week of progressive right sided pounding headache associated with diminished blurry, then darkening, vision. She had been referred to ophthalmology who was concerned for optic neuritis, sent to the ED for further work up where an MRI at [facility] showed restricted diffusion in the right optic nerve. Neurology was consulted, recommending MRI of cervical spine due to some transitory numbness in bilateral upper extremities worse with heat, and of the orbits. This showed no lesions in the C-spine and a few FLAIR hyperintensities with one suspicious for MS-like lesion. IV steroids were recommended and she was admitted to [facility].  Assessment & Plan: Right optic neuritis: Total vision loss currently. Retroorbital headache improved. Neuromyelitis optica Ab negative. - Solumedrol 1g IV daily x5 days (5/4 - 5/8) (w/PPI GI ppx), due to failure to improve vision, neurology considering IVIG.   - Pain control with oxycodone, improved with dosing 5-10mg  prn mod-severe pain, though overall diminished need. 1-2 per day. - OT evaluation recommending outpatient OT. Will be arranged prior to discharge.  - Pt asking about disability application. Social work is aware and will assist patient.  - Will need outpatient follow up with Dr. Epimenio Foot at [facility].   Tobacco use: 1/4 - 1/2ppd, refusing nicotine patch.  - Provided in depth cessation counseling today for 5 minutes at bedside with mother who works in healthcare  Constipation: Acute.  - Started milk of magnesia which has helped pt in the past.   DVT prophylaxis: Lovenox Code Status: Full Family Communication: None at bedside Disposition Plan: Neurology considering IVIG x7 days if symptoms remain steroid non-responsive. Consultants: Neurology Procedures:  None  Antimicrobials:  None   Subjective: Headache improved but vision not returned.   Objective: BP 122/77 (BP Location: Left Arm)   Pulse 86   Temp 98.4 F (36.9 C) (Oral)   Resp 20   Ht 5\' 1"  (1.549 m)   Wt 65.8 kg (145 lb)   LMP 08/05/2017   SpO2 100%   BMI 27.40 kg/m   Gen: 27 y.o. female in no distress Neuro: Alert and oriented. No light perception on right, right pupil very minimally directly/consensually reactive. Left pupil only directly reactive, sluggish consensual response. CNs otherwise intact. Eyes appear normal with preserved visual acuity on left. Gait is narrow-based.  Time spent: 15 minutes.  Tyrone Nine, MD [facility] Pager [phone]  If 7PM-7AM, please contact night-coverage [email or web address] Password [email or web address] 08/23/2017, 11:16 AM

## 2017-08-23 NOTE — Progress Notes (Addendum)
Subjective: Patient seen in room, she admits to continuous vision loss without any light perception, she denies any flashing lights or shadows.  She admits to right ocular pain with movement but has since improved.  She denies frequent headaches, actually admits to improvement in her headaches, denies nausea, vomiting, unsteady gait or lightheadedness.  Patient admits to instances where she has issues with depth perception and has to remember to look to ensure that staff is actually on the right side.    Exam: Vitals:   08/23/17 0523 08/23/17 0837  BP: 102/64 122/77  Pulse: (!) 54 86  Resp: 18 20  Temp: 98.1 F (36.7 C) 98.4 F (36.9 C)  SpO2: 98% 100%    Physical Exam  HEENT-  Normocephalic, no lesions, without obvious abnormality.  Normal external eye and conjunctiva.   Cardiovascular- S1-S2 audible, pulses palpable throughout   Lungs-no rhonchi or wheezing noted, no excessive working breathing.  Saturations within normal limits Abdomen- All 4 quadrants palpated and nontender Musculoskeletal-no joint tenderness, deformity or swelling Skin-warm and dry, no hyperpigmentation, vitiligo, or suspicious lesions  Neuro:  Mental Status: Alert, oriented, thought content appropriate.  Speech fluent without evidence of aphasia.  Able to follow 3 step commands without difficulty. Cranial Nerves: II: No vision in the right eye-no light perception III,IV, VI: ptosis not present, extra-ocular motions intact bilaterally pupils equal, round, reactive to light.  With afferent pupil defect on the right.  No vision on the right eye, no blink to threat.   V,VII: smile symmetric, facial light touch sensation normal bilaterally VIII: hearing intact to voice IX,X: uvula rises symmetrically Motor: Patient moves all extremities equally Tone and bulk:normal tone throughout; no atrophy noted Cerebellar: No deficits.   Medications:  . enoxaparin (LOVENOX) injection  40 mg Subcutaneous Q24H  .  pantoprazole  40 mg Oral Q0600  . sodium chloride flush  3 mL Intravenous Q12H    Assessment: Jennifer N Garlandis a 27 y.o.femalewith no past medical history, who was sent over by her ophthalmologist for evaluation of possible optic neuritis. Patient had complaints of progressively worsening right-sided headache and painful right vision loss that started about a week ago. MRI without contrast done on admission to the ER showed restricted diffusion in the right optic nerve. MRI of the orbits showed findings consistent with acute optic neuritis.  Neuro was consulted for evaluation.  MRI brain as well as the cervical spine did not show findings suggesting demyelinating disease or acute abnormalities.  1. Right retrobulbar optic neuritis-confirmed by MRI orbits with and without contrast.  Patient continues to have complete right vision loss, with no light perception and started to have issues with depth perception.  She currently is working with physical therapy.  Patient is on her fourth dose of IV Solu-Medrol and will complete last dose tomorrow.   2. If no improvement in vision by tomorrow, patient will be classified as a steroid unresponsive optic neuritis.  According to the case series study by [facility], may recommend IVIG for 7 days for this steroid unresponse of optic neuritis. She does admit to improving photophobia, ocular pain with movement and headache.  Recommendations: - Continue Solu-Medrol IV 1 g x 5 days. Last dose tomorrow - If no improvement in vision, will recommend starting IVIG for 5-7 on Thursday - Protonix for GI prophylaxis - Continue working with occupational therapy for depth perception - Neuromyelitis optica antibodies: negative - PT to follow up [facility] in 1-2 weeks  Noralee Space DNP, Neuro-hospitalist Team,  [phone] 08/23/2017, 10:05 AM  Electronically signed: Dr. Caryl Pina

## 2017-08-24 LAB — GLUCOSE, CAPILLARY: Glucose-Capillary: 102 mg/dL — ABNORMAL HIGH (ref 65–99)

## 2017-08-24 NOTE — Progress Notes (Signed)
PROGRESS NOTE    Jennifer [name]  ZOX:[id number] DOB: 11-04-1990 DOA: 08/19/2017 PCP: Patient, No Pcp Per    Brief Narrative:  27 year old female who presented with headache and vision loss.  Patient does have the significant past medical history for tobacco abuse.  Patient reported 7-day history of severe right-sided headache, associated with progressive right eye visual loss.  On the initial physical examination blood pressure 117/69, heart rate 88, respirate 18, temperature 98.8, oxygen saturation 100%.  Moist mucous membranes, lungs clear to auscultation bilaterally, heart S1-S2 present and rhythmic, the abdomen soft nontender, no lower extremity edema.  Patient had diminished fill vision in the right eye.  Patient was admitted to the hospital with the working diagnosis of right optic neuritis.  Assessment & Plan:   Principal Problem:   Optic neuritis, right Active Problems:   Nicotine abuse   1. Optic neuritis. Patient continue to be symptomatic with no recovery of vision on the right eye. Will continue to follow neurology recommendations with high dose steroids #5 days. If no improvement plan for IV IG infusion. Continue prophylactic ppi.   2. Tobacco abuse. Smoking cessation counseling.   3. Constipation. Continue bowel regimen with Milk of Mag.    DVT prophylaxis: enoxaparin   Code Status:  full Family Communication: no family at the bedside  Disposition Plan: home when completion of iv steroids/ iv IG.    Consultants:   Neurology   Procedures:     Antimicrobials:       Subjective: Patient continue to have loss of vision on the right eye, no headache, no difficulty speaking or swallowing. No diplopia.   Objective: Vitals:   08/23/17 1528 08/23/17 2123 08/23/17 2342 08/24/17 0407  BP: 112/85 109/76 104/65 103/80  Pulse: 69 67 (!) 54 (!) 52  Resp: 20 18 20 18   Temp: 98.2 F (36.8 C) 97.9 F (36.6 C) 98.2 F (36.8 C) 98.1 F (36.7 C)  TempSrc: Oral  Oral Oral Oral  SpO2: 95% 98% 99% 99%  Weight:      Height:        Intake/Output Summary (Last 24 hours) at 08/24/2017 1205 Last data filed at 08/24/2017 0300 Gross per 24 hour  Intake 230 ml  Output -  Net 230 ml   Filed Weights   08/19/17 1840  Weight: 65.8 kg (145 lb)    Examination:   General: Not in pain or dyspnea Neurology: Awake and alert, non focal  E ENT: no pallor, no icterus, oral mucosa moist Cardiovascular: No JVD. S1-S2 present, rhythmic, no gallops, rubs, or murmurs. No lower extremity edema. Pulmonary: vesicular breath sounds bilaterally, adequate air movement, no wheezing, rhonchi or rales. Gastrointestinal. Abdomen with no organomegaly, non tender, no rebound or guarding Skin. No rashes Musculoskeletal: no joint deformities     Data Reviewed: I have personally reviewed following labs and imaging studies  CBC: Recent Labs  Lab 08/19/17 1009 08/20/17 0056  WBC 5.1 7.5  NEUTROABS 2.9  --   HGB 14.2 14.4  HCT 44.3 43.8  MCV 86.7 86.1  PLT 181 181   Basic Metabolic Panel: Recent Labs  Lab 08/19/17 1009 08/20/17 0056  NA 138  --   K 4.2  --   CL 104  --   CO2 24  --   GLUCOSE 95  --   BUN 8  --   CREATININE 0.79 0.87  CALCIUM 9.3  --    GFR: Estimated Creatinine Clearance: 84.3 mL/min (by C-G formula based on  SCr of 0.87 mg/dL). Liver Function Tests: No results for input(s): AST, ALT, ALKPHOS, BILITOT, PROT, ALBUMIN in the last 168 hours. No results for input(s): LIPASE, AMYLASE in the last 168 hours. No results for input(s): AMMONIA in the last 168 hours. Coagulation Profile: No results for input(s): INR, PROTIME in the last 168 hours. Cardiac Enzymes: No results for input(s): CKTOTAL, CKMB, CKMBINDEX, TROPONINI in the last 168 hours. BNP (last 3 results) No results for input(s): PROBNP in the last 8760 hours. HbA1C: No results for input(s): HGBA1C in the last 72 hours. CBG: Recent Labs  Lab 08/21/17 1614 08/22/17 0741  08/24/17 0609  GLUCAP 152* 111* 102*   Lipid Profile: No results for input(s): CHOL, HDL, LDLCALC, TRIG, CHOLHDL, LDLDIRECT in the last 72 hours. Thyroid Function Tests: No results for input(s): TSH, T4TOTAL, FREET4, T3FREE, THYROIDAB in the last 72 hours. Anemia Panel: No results for input(s): VITAMINB12, FOLATE, FERRITIN, TIBC, IRON, RETICCTPCT in the last 72 hours.    Radiology Studies: I have reviewed all of the imaging during this hospital visit personally     Scheduled Meds: . enoxaparin (LOVENOX) injection  40 mg Subcutaneous Q24H  . pantoprazole  40 mg Oral Q0600  . sodium chloride flush  3 mL Intravenous Q12H   Continuous Infusions: . sodium chloride       LOS: 5 days        Jennifer [name] Annett Gula, MD [facility] Pager [phone]

## 2017-08-24 NOTE — Progress Notes (Signed)
Occupational Therapy Treatment Patient Details Name: Jennifer [name] [name] MRN: [id number] DOB: 1990-08-08 Today's Date: 08/24/2017    History of present illness 27 y.o. female with no past medical history, who was sent over by her ophthalmologist for evaluation of possible optic neuritis. MRI of the orbits showed findings consistent with acute optic neuritis.     OT comments  Pt states she no longer has pian with head or eye movement but is unable to see anything with her R eye. Pt mobilizing and completing basic ADL tasks independently. Educated pt on smoking cessation and Scientist, clinical (histocompatibility and immunogenetics) provided. Pt discussed her concerns for employability and driving. Pt has been given the contact for [employer], who can assist with concerns regarding employment and school as well as any environmental adaptive aids that will increase pt's safety and independence within the home. Will sign off at this time and recommend follow up with the outpt center.   Follow Up Recommendations  Outpatient OT;Supervision - Intermittent    Equipment Recommendations  None recommended by OT    Recommendations for Other Services      Precautions / Restrictions Precautions Precautions: Fall Precaution Comments: impaired depth perception       Mobility Bed Mobility Overal bed mobility: Independent                Transfers Overall transfer level: Independent                    Balance Overall balance assessment: Mild deficits observed, not formally tested                                         ADL either performed or assessed with clinical judgement   ADL Overall ADL's : At baseline                                       General ADL Comments: for basic ADL; assistance required for IADL tasks, i.e. cooking,driving     Vision   Additional Comments: vision loss R eye   Perception     Praxis      Cognition Arousal/Alertness:  Awake/alert Behavior During Therapy: WFL for tasks assessed/performed Overall Cognitive Status: Within Functional Limits for tasks assessed                                          Exercises     Shoulder Instructions       General Comments      Pertinent Vitals/ Pain       Pain Assessment: No/denies pain  Home Living                                          Prior Functioning/Environment              Frequency           Progress Toward Goals  OT Goals(current goals can now be found in the care plan section)  Progress towards OT goals: Goals met/education completed, patient discharged from OT(acute )  Acute Rehab OT Goals Patient Stated Goal: to  be able to work OT Goal Formulation: With patient Time For Goal Achievement: 09/04/17 Potential to Achieve Goals: Good ADL Goals Additional ADL Goal #1: pt will verbalize 3 vision compensatory strategies for ADL safety Additional ADL Goal #2: pt will verbalize 3 vision compensatory strategies for IADL/home mgt safety Additional ADL Goal #3: pt will verbalize 3 vision compensatory strategies for safety during mobility  Plan Discharge plan remains appropriate    Co-evaluation                 AM-PAC PT "6 Clicks" Daily Activity     Outcome Measure   Help from another person eating meals?: None Help from another person taking care of personal grooming?: None Help from another person toileting, which includes using toliet, bedpan, or urinal?: None Help from another person bathing (including washing, rinsing, drying)?: None Help from another person to put on and taking off regular upper body clothing?: None Help from another person to put on and taking off regular lower body clothing?: None 6 Click Score: 24    End of Session    OT Visit Diagnosis: Other (comment)(low vision R eye)   Activity Tolerance Patient tolerated treatment well   Patient Left in bed;with call  bell/phone within reach   Nurse Communication Other (comment)(DC plan)        Time: 3009-7949 OT Time Calculation (min): 18 min  Charges: OT General Charges $OT Visit: 1 Visit OT Treatments $Self Care/Home Management : 8-22 mins  [facility], OT/L  [phone] 08/24/2017   Jennifer [name] [name],Jennifer [name] 08/24/2017, 10:09 AM

## 2017-08-25 LAB — GLUCOSE, CAPILLARY: Glucose-Capillary: 102 mg/dL — ABNORMAL HIGH (ref 65–99)

## 2017-08-25 MED ORDER — IMMUNE GLOBULIN (HUMAN) 5 GM/50ML IV SOLN
400.0000 mg/kg | INTRAVENOUS | Status: AC
  Administered 2017-08-25 – 2017-08-29 (×5): 25 g via INTRAVENOUS
  Filled 2017-08-25 (×7): qty 50

## 2017-08-25 MED ORDER — POTASSIUM CHLORIDE IN NACL 20-0.9 MEQ/L-% IV SOLN: INTRAVENOUS | Status: DC

## 2017-08-25 MED ORDER — SODIUM CHLORIDE 0.9 % IV SOLN
Freq: Once | INTRAVENOUS | Status: AC
  Administered 2017-08-25: 19:00:00 via INTRAVENOUS

## 2017-08-25 MED ORDER — OXYCODONE HCL 5 MG PO TABS
5.0000 mg | ORAL_TABLET | ORAL | Status: DC | PRN
  Administered 2017-08-25 – 2017-08-26 (×3): 5 mg via ORAL
  Filled 2017-08-25 (×3): qty 1

## 2017-08-25 MED ORDER — SODIUM CHLORIDE 0.9 % IV BOLUS
500.0000 mL | Freq: Once | INTRAVENOUS | Status: AC
  Administered 2017-08-25: 500 mL via INTRAVENOUS

## 2017-08-25 NOTE — Progress Notes (Signed)
Patient off unit with sister

## 2017-08-25 NOTE — Progress Notes (Signed)
Patient received with Immune globin stopped. Will restart once VS is stable

## 2017-08-25 NOTE — Progress Notes (Signed)
RN begin to run Immune Globulin 10% and while on the third titration at 78.8 mg patient stated she felt light headed. Beginning blood pressure was 111/79 and dropped down to 96/57 and Hr rate of 56. Pt did not report any shortness of breath. MD was called and advised to stop the Immune  Globulin and bolus 1000 ml of normal saline over an hr and then re-start Immune Globulin once BP was stable. Changes and recommendations were giver to night RN taking over for the shift. Patient was updated on the new interventions.

## 2017-08-25 NOTE — Progress Notes (Signed)
PROGRESS NOTE    RAYMA HEGG  WUJ:[id number] DOB: December 20, 1990 DOA: 08/19/2017 PCP: Patient, No Pcp Per    Brief Narrative:  27 year old female who presented with headache and vision loss.  Patient does have the significant past medical history for tobacco abuse.  Patient reported 7-day history of severe right-sided headache, associated with progressive right eye visual loss.  On the initial physical examination blood pressure 117/69, heart rate 88, respirate 18, temperature 98.8, oxygen saturation 100%.  Moist mucous membranes, lungs clear to auscultation bilaterally, heart S1-S2 present and rhythmic, the abdomen soft nontender, no lower extremity edema.  Patient had diminished fill vision in the right eye.  Patient was admitted to the hospital with the working diagnosis of right optic neuritis   Assessment & Plan:   Principal Problem:   Optic neuritis, right Active Problems:   Nicotine abuse   1. Optic neuritis. Completed 5 days of high dose IV steroids with no significant improvement of her symptoms, will proceed with IV IG infusions per neurology recommendations, continue neuro checks per unit protocol.   2. Tobacco abuse. Continue smoking cessation counseling.   3. Constipation. On Milk of Mag.    DVT prophylaxis: enoxaparin   Code Status:  full Family Communication: I spoke with patient's mother at the bedside and all questions were addressed.  Disposition Plan: home when completion of  iv IG.    Consultants:   Neurology   Procedures:     Antimicrobials:       Subjective: Patient continue to have visual loss on the right side, no dysphagia or diplopia. No dyspnea.   Objective: Vitals:   08/25/17 0000 08/25/17 0400 08/25/17 0800 08/25/17 1059  BP: 115/83 120/83 113/66 103/72  Pulse: 65 (!) 55 60 (!) 57  Resp: 18 18 14 14   Temp: 98 F (36.7 C) 98 F (36.7 C) 98 F (36.7 C) 98 F (36.7 C)  TempSrc: Oral Oral Oral Oral  SpO2: 97% 99% 97%  97%  Weight:      Height:        Intake/Output Summary (Last 24 hours) at 08/25/2017 1100 Last data filed at 08/25/2017 0500 Gross per 24 hour  Intake 150 ml  Output -  Net 150 ml   Filed Weights   08/19/17 1840  Weight: 65.8 kg (145 lb)    Examination:   General: Not in pain or dyspnea.  Neurology: Awake and alert, non focal  E ENT: no pallor, no icterus, oral mucosa moist Cardiovascular: No JVD. S1-S2 present, rhythmic, no gallops, rubs, or murmurs. No lower extremity edema. Pulmonary: vesicular breath sounds bilaterally, adequate air movement, no wheezing, rhonchi or rales. Gastrointestinal. Abdomen with no organomegaly, non tender, no rebound or guarding Skin. No rashes Musculoskeletal: no joint deformities     Data Reviewed: I have personally reviewed following labs and imaging studies  CBC: Recent Labs  Lab 08/19/17 1009 08/20/17 0056  WBC 5.1 7.5  NEUTROABS 2.9  --   HGB 14.2 14.4  HCT 44.3 43.8  MCV 86.7 86.1  PLT 181 181   Basic Metabolic Panel: Recent Labs  Lab 08/19/17 1009 08/20/17 0056  NA 138  --   K 4.2  --   CL 104  --   CO2 24  --   GLUCOSE 95  --   BUN 8  --   CREATININE 0.79 0.87  CALCIUM 9.3  --    GFR: Estimated Creatinine Clearance: 84.3 mL/min (by C-G formula based on SCr of 0.87 mg/dL).  Liver Function Tests: No results for input(s): AST, ALT, ALKPHOS, BILITOT, PROT, ALBUMIN in the last 168 hours. No results for input(s): LIPASE, AMYLASE in the last 168 hours. No results for input(s): AMMONIA in the last 168 hours. Coagulation Profile: No results for input(s): INR, PROTIME in the last 168 hours. Cardiac Enzymes: No results for input(s): CKTOTAL, CKMB, CKMBINDEX, TROPONINI in the last 168 hours. BNP (last 3 results) No results for input(s): PROBNP in the last 8760 hours. HbA1C: No results for input(s): HGBA1C in the last 72 hours. CBG: Recent Labs  Lab 08/21/17 1614 08/22/17 0741 08/24/17 0609 08/25/17 0619  GLUCAP 152*  111* 102* 102*   Lipid Profile: No results for input(s): CHOL, HDL, LDLCALC, TRIG, CHOLHDL, LDLDIRECT in the last 72 hours. Thyroid Function Tests: No results for input(s): TSH, T4TOTAL, FREET4, T3FREE, THYROIDAB in the last 72 hours. Anemia Panel: No results for input(s): VITAMINB12, FOLATE, FERRITIN, TIBC, IRON, RETICCTPCT in the last 72 hours.    Radiology Studies: I have reviewed all of the imaging during this hospital visit personally     Scheduled Meds: . enoxaparin (LOVENOX) injection  40 mg Subcutaneous Q24H  . pantoprazole  40 mg Oral Q0600  . sodium chloride flush  3 mL Intravenous Q12H   Continuous Infusions: . sodium chloride       LOS: 6 days        Valery Amedee Annett Gula, MD [facility] Pager [phone]

## 2017-08-25 NOTE — Progress Notes (Signed)
Dr Aroor informed patient 's BP 96/56 prior to starting Immune Globin 10%. Per MD he will order 500 ml bolus and I should  restart pt on Immune globin  10% and inform him when systolic BP is below 90. Patient made aware

## 2017-08-25 NOTE — Progress Notes (Signed)
Dr Aroor informed BP currently 102/55. MD asked if its OK to restart patient  On Immune Globulin 10%. MD stated " yes it ok " , Will start Immune Globin   10% when patient returns to unit.

## 2017-08-25 NOTE — Progress Notes (Signed)
Patient back to unit. Patient informed per MD Immune globin 10% can be started. However I would recheck her BP prior to starting immune globin 10%.

## 2017-08-25 NOTE — Progress Notes (Signed)
RN called IV mixture department to get an update on  when Immune Globulin medicine would be ready to pick up. Representative stated medicine had still not been verified by Pharmacy , and they will would call when its ready to be picked up to administer.

## 2017-08-25 NOTE — Progress Notes (Addendum)
Subjective: The patient continues to have no vision in her right eye.  Patient is unable to see any light.  She states that she is completely blind with no improvement with the Solu-Medrol, other than resolution of the eye pain.  Exam: Vitals:   08/25/17 0800 08/25/17 1059  BP: 113/66 103/72  Pulse: 60 (!) 57  Resp: 14 14  Temp: 98 F (36.7 C) 98 F (36.7 C)  SpO2: 97% 97%    Physical Exam  HEENT-  Normocephalic, no lesions, without obvious abnormality.  Normal external eye and conjunctiva.   Lungs- no excessive working breathing.  Saturations within normal limit Extremities- Warm, dry and intact Musculoskeletal-no joint tenderness, deformity or swelling Skin-warm and dry, no hyperpigmentation, vitiligo, or suspicious lesions   Neuro:  Mental Status: Alert, oriented, thought content appropriate.  Speech fluent without evidence of aphasia.  Able to follow 3 step commands without difficulty. Cranial Nerves: II: Right eye has no vision, left eye has 20/25 vision III,IV, VI: ptosis not present, extra-ocular motions intact bilaterally pupils equal, round, right eye is not reactive to light but left eye is reactive to light and patient has an afferent pupillary defect in the right eye--patient also has a disconjugate gaze when looking forward with the right eye slightly exotropic V,VII: smile symmetric, facial light touch sensation normal bilaterally VIII: hearing normal bilaterally IX,X: uvula rises symmetrically XI: bilateral shoulder shrug XII: midline tongue extension Motor: Right : Upper extremity   5/5    Left:     Upper extremity   5/5  Lower extremity   5/5     Lower extremity   5/5 Tone and bulk:normal tone throughout; no atrophy noted Sensory: Pinprick and light touch intact throughout, bilaterally Deep Tendon Reflexes: 2+ and symmetric throughout Plantars: Right: downgoing   Left: downgoing Cerebellar: normal finger-to-nose, normal rapid alternating movements and normal  heel-to-shin test     Medications:  Scheduled: . enoxaparin (LOVENOX) injection  40 mg Subcutaneous Q24H  . Immune Globulin 10%  400 mg/kg Intravenous Q24 Hr x 5  . pantoprazole  40 mg Oral Q0600  . sodium chloride flush  3 mL Intravenous Q12H    Pertinent Labs/Diagnostics: None  No results found.   Felicie Morn PA-C [facility] [phone]  Impression: Right optic neuritis refractory to IV pulsed dose steroid therapy.  1. History review: The patient was sent over by her ophthalmologist for evaluation of possible optic neuritis. Patient had complaints of progressively worsening right-sided headache and painful right vision loss that started about a week PTA. MRI with contrast done on admission to the ER showed restricted diffusion, edema and abnormal enhancement of the right optic nerve. MRI of the orbits showed findings consistent with acute optic neuritis. Neuro was consulted for evaluation.  Her MRI did show approximately 5 total T2/FLAIR signal abnormalities involving the supratentorial cerebral white matter, nonspecific, but perhaps most concerning for possible demyelinating disease given the right optic nerve findings.  Recommendations: --At this point due to no resolution of vision with steroids will start IVIG for 5 days. --Continue Protonix for GI prophylaxis --Continue working with occupational therapy for depth perception - Neuromyelitis optica antibodies were negative -- High likelihood that she has MS --Patient will need to follow-up with Dr. Epimenio Foot of [facility] neurology in 1 to 2 weeks   Electronically signed: Dr. Caryl Pina 08/25/2017, 11:23 AM

## 2017-08-26 LAB — BASIC METABOLIC PANEL
Anion gap: 5 (ref 5–15)
BUN: 14 mg/dL (ref 6–20)
CO2: 27 mmol/L (ref 22–32)
Calcium: 8.2 mg/dL — ABNORMAL LOW (ref 8.9–10.3)
Chloride: 104 mmol/L (ref 101–111)
Creatinine, Ser: 0.8 mg/dL (ref 0.44–1.00)
GFR calc Af Amer: >60 mL/min (ref >60)
GFR calc non Af Amer: >60 mL/min (ref >60)
Glucose, Bld: 85 mg/dL (ref 65–99)
Potassium: 3.8 mmol/L (ref 3.5–5.1)
Sodium: 136 mmol/L (ref 135–145)

## 2017-08-26 LAB — GLUCOSE, CAPILLARY: Glucose-Capillary: 73 mg/dL (ref 65–99)

## 2017-08-26 NOTE — Progress Notes (Signed)
PROGRESS NOTE    Jennifer [name]  ZOX:[id number] DOB: 10-21-1990 DOA: 08/19/2017 PCP: Patient, No Pcp Per    Brief Narrative:  27 year old female who presented with headache and vision loss. Patient does have the significant past medical history for tobacco abuse. Patient reported 7-day history of severe right-sided headache, associated with progressive right eye visual loss. On the initial physical examination blood pressure 117/69, heart rate 88, respirate 18, temperature 98.8, oxygen saturation 100%.Moist mucous membranes, lungs clear to auscultation bilaterally, heart S1-S2 present and rhythmic, the abdomen soft nontender, no lower extremity edema.Patient had diminished fill vision in the right eye.  Patient was admitted to the hospital with the working diagnosis of right optic neuritis   Assessment & Plan:   Principal Problem:   Optic neuritis, right Active Problems:   Nicotine abuse   1. Optic neuritis. Patient tolerating well IV IG infusions, she has noticed improvement in her vision, not yet back to baseline, will continue to follow infusion protocol. Follow on neurology recommendations. Close follow up of renal function while receiving IV IG.   2. Tobacco abuse. Smoking cessation counseling.   3. Constipation. Continue Milk of Mag.   DVT prophylaxis:enoxaparin Code Status:full Family Communication:Today no family at the bedside  Disposition Plan:home when completion of  iv IG.   Consultants:  Neurology  Procedures:    Antimicrobials    Subjective: Patient reports improved vision on the right eye, no headache, diplopia or dysphagia.   Objective: Vitals:   08/26/17 0044 08/26/17 0123 08/26/17 0419 08/26/17 0842  BP: 94/64 104/69 91/69 (!) 92/50  Pulse: (!) 53 (!) 58 (!) 52 71  Resp: 18 18 18 20   Temp:   98.2 F (36.8 C) 98.6 F (37 C)  TempSrc:    Oral  SpO2: 99% 96% 100% 100%  Weight:      Height:         Intake/Output Summary (Last 24 hours) at 08/26/2017 1121 Last data filed at 08/26/2017 0500 Gross per 24 hour  Intake 240 ml  Output -  Net 240 ml   Filed Weights   08/19/17 1840  Weight: 65.8 kg (145 lb)    Examination:   General: Not in pain or dyspnea, deconditioned.  Neurology: Awake and alert, non focal  E ENT: mild pallor, no icterus, oral mucosa moist Cardiovascular: No JVD. S1-S2 present, rhythmic, no gallops, rubs, or murmurs. No lower extremity edema. Pulmonary: vesicular breath sounds bilaterally, adequate air movement, no wheezing, rhonchi or rales. Gastrointestinal. Abdomen flat, no organomegaly, non tender, no rebound or guarding Skin. No rashes Musculoskeletal: no joint deformities     Data Reviewed: I have personally reviewed following labs and imaging studies  CBC: Recent Labs  Lab 08/20/17 0056  WBC 7.5  HGB 14.4  HCT 43.8  MCV 86.1  PLT 181   Basic Metabolic Panel: Recent Labs  Lab 08/20/17 0056 08/26/17 0444  NA  --  136  K  --  3.8  CL  --  104  CO2  --  27  GLUCOSE  --  85  BUN  --  14  CREATININE 0.87 0.80  CALCIUM  --  8.2*   GFR: Estimated Creatinine Clearance: 91.7 mL/min (by C-G formula based on SCr of 0.8 mg/dL). Liver Function Tests: No results for input(s): AST, ALT, ALKPHOS, BILITOT, PROT, ALBUMIN in the last 168 hours. No results for input(s): LIPASE, AMYLASE in the last 168 hours. No results for input(s): AMMONIA in the last 168 hours. Coagulation Profile:  No results for input(s): INR, PROTIME in the last 168 hours. Cardiac Enzymes: No results for input(s): CKTOTAL, CKMB, CKMBINDEX, TROPONINI in the last 168 hours. BNP (last 3 results) No results for input(s): PROBNP in the last 8760 hours. HbA1C: No results for input(s): HGBA1C in the last 72 hours. CBG: Recent Labs  Lab 08/21/17 1614 08/22/17 0741 08/24/17 0609 08/25/17 0619 08/26/17 0608  GLUCAP 152* 111* 102* 102* 73   Lipid Profile: No results for  input(s): CHOL, HDL, LDLCALC, TRIG, CHOLHDL, LDLDIRECT in the last 72 hours. Thyroid Function Tests: No results for input(s): TSH, T4TOTAL, FREET4, T3FREE, THYROIDAB in the last 72 hours. Anemia Panel: No results for input(s): VITAMINB12, FOLATE, FERRITIN, TIBC, IRON, RETICCTPCT in the last 72 hours.    Radiology Studies: I have reviewed all of the imaging during this hospital visit personally     Scheduled Meds: . enoxaparin (LOVENOX) injection  40 mg Subcutaneous Q24H  . Immune Globulin 10%  400 mg/kg Intravenous Q24 Hr x 5  . pantoprazole  40 mg Oral Q0600  . sodium chloride flush  3 mL Intravenous Q12H   Continuous Infusions: . sodium chloride       LOS: 7 days        Wilba Mutz Annett Gula, MD [facility] Pager [phone]

## 2017-08-26 NOTE — Progress Notes (Signed)
Subjective: Subjectively with improved vision in right eye today.   Objective: Current vital signs: BP (!) 92/50 (BP Location: Left Arm)   Pulse 71   Temp 98.6 F (37 C) (Oral)   Resp 20   Ht 5' 1"  (1.549 m)   Wt 65.8 kg (145 lb)   LMP 08/05/2017   SpO2 100%   BMI 27.40 kg/m  Vital signs in last 24 hours: Temp:  [97.6 F (36.4 C)-98.6 F (37 C)] 98.6 F (37 C) (05/10 0842) Pulse Rate:  [50-82] 71 (05/10 0842) Resp:  [14-20] 20 (05/10 0842) BP: (91-116)/(50-79) 92/50 (05/10 0842) SpO2:  [96 %-100 %] 100 % (05/10 0842)  Intake/Output from previous day: 05/09 0701 - 05/10 0700 In: 360 [P.O.:360] Out: -  Intake/Output this shift: No intake/output data recorded. Nutritional status:  Diet Order           Diet regular Room service appropriate? Yes; Fluid consistency: Thin  Diet effective now          Neurologic Exam: Ment: Alert and fully oriented. Speech fluent with intact comprehension.  CN: RAPD on right. Left visual acuity intact to reading normal size print. Right eye now able to perceive examiner's hand movement in bright light, but unable to differentiate shapes; can correctly identify hand movement as vertical (up and down) with right eye vision. EOMI without nystagmus. Face symmetric. Phonation intact.  Motor: 5/5 x 4 Cerebellar: No ataxia with FNF bilaterally  Lab Results: Results for orders placed or performed during the hospital encounter of 08/19/17 (from the past 48 hour(s))  Glucose, capillary     Status: Abnormal   Collection Time: 08/25/17  6:19 AM  Result Value Ref Range   Glucose-Capillary 102 (H) 65 - 99 mg/dL   Comment 1 Notify RN    Comment 2 Document in Chart   Basic metabolic panel     Status: Abnormal   Collection Time: 08/26/17  4:44 AM  Result Value Ref Range   Sodium 136 135 - 145 mmol/L   Potassium 3.8 3.5 - 5.1 mmol/L   Chloride 104 101 - 111 mmol/L   CO2 27 22 - 32 mmol/L   Glucose, Bld 85 65 - 99 mg/dL   BUN 14 6 - 20 mg/dL   Creatinine, Ser 0.80 0.44 - 1.00 mg/dL   Calcium 8.2 (L) 8.9 - 10.3 mg/dL   GFR calc non Af Amer >60 >60 mL/min   GFR calc Af Amer >60 >60 mL/min    Comment: (NOTE) The eGFR has been calculated using the CKD EPI equation. This calculation has not been validated in all clinical situations. eGFR's persistently <60 mL/min signify possible Chronic Kidney Disease.    Anion gap 5 5 - 15    Comment: Performed at [facility] [address], [place], [place] [address]  Glucose, capillary     Status: None   Collection Time: 08/26/17  6:08 AM  Result Value Ref Range   Glucose-Capillary 73 65 - 99 mg/dL   Comment 1 Notify RN    Comment 2 Document in Chart     No results found for this or any previous visit (from the past 240 hour(s)).  Lipid Panel No results for input(s): CHOL, TRIG, HDL, CHOLHDL, VLDL, LDLCALC in the last 72 hours.  Studies/Results: No results found.  Medications:  Scheduled: . enoxaparin (LOVENOX) injection  40 mg Subcutaneous Q24H  . Immune Globulin 10%  400 mg/kg Intravenous Q24 Hr x 5  . pantoprazole  40  mg Oral Q0600  . sodium chloride flush  3 mL Intravenous Q12H   Continuous: . sodium chloride      Impression: Right optic neuritis refractory to IV pulsed dose steroid therapy.  1. History review: The patient was sent over by her ophthalmologist for evaluation of possible optic neuritis. Patient had complaints of progressively worsening right-sided headache and painful right vision loss that started about a week PTA. MRI with contrast done on admission to the ER showed restricted diffusion, edema and abnormal enhancement of the right optic nerve. MRI of the orbits showed findings consistent with acute optic neuritis. Neuro was consulted for evaluation.  Her MRI did show approximately 5 total T2/FLAIR signal abnormalities involving the supratentorial cerebral white matter, nonspecific, but perhaps most concerning for possible demyelinating disease given the  right optic nerve findings. 2. Today she exhibits some return of vision in her right eye after first dose of IVIG. Able to discern movement in bright light but not able to differentiate shapes.   Recommendations: --Continue IVIG for a total of 5 days (today, May 10, is day 2/5). --Continue Protonix for GI prophylaxis --Continue working with occupational therapy for depth perception - Neuromyelitis optica antibodies were negative --High likelihood that she has MS. Discussed Copaxone versus Avonex as possible first treatment choices as an outpatient, given excellent cost/benefit/side effect profile.  --Patient will need to follow-up with Dr. Felecia Shelling of [facility] neurology in 1 to 2 weeks    LOS: 7 days   @Electronically  signed: Dr. Kerney Elbe 08/26/2017  9:15 AM

## 2017-08-27 DIAGNOSIS — K5900 Constipation, unspecified: Secondary | ICD-10-CM

## 2017-08-27 LAB — BASIC METABOLIC PANEL
Anion gap: 6 (ref 5–15)
BUN: 15 mg/dL (ref 6–20)
CO2: 28 mmol/L (ref 22–32)
Calcium: 8.6 mg/dL — ABNORMAL LOW (ref 8.9–10.3)
Chloride: 103 mmol/L (ref 101–111)
Creatinine, Ser: 0.92 mg/dL (ref 0.44–1.00)
GFR calc Af Amer: >60 mL/min (ref >60)
GFR calc non Af Amer: >60 mL/min (ref >60)
Glucose, Bld: 86 mg/dL (ref 65–99)
Potassium: 3.9 mmol/L (ref 3.5–5.1)
Sodium: 137 mmol/L (ref 135–145)

## 2017-08-27 LAB — GLUCOSE, CAPILLARY: Glucose-Capillary: 84 mg/dL (ref 65–99)

## 2017-08-27 MED ORDER — ACETAMINOPHEN 325 MG PO TABS
650.0000 mg | ORAL_TABLET | Freq: Four times a day (QID) | ORAL | Status: DC | PRN
  Administered 2017-08-27 – 2017-08-31 (×8): 650 mg via ORAL
  Filled 2017-08-27 (×8): qty 2

## 2017-08-27 NOTE — Progress Notes (Signed)
PROGRESS NOTE    Jennifer [name]  BJY:[id number] DOB: [birth year] DOA: 08/19/2017 PCP: Patient, No Pcp Per    Brief Narrative:  27 year old female who presented with headache and vision loss. Patient does have the significant past medical history for tobacco abuse. Patient reported 7-day history of severe right-sided headache, associated with progressive right eye visual loss. On the initial physical examination blood pressure 117/69, heart rate 88, respirate 18, temperature 98.8, oxygen saturation 100%.Moist mucous membranes, lungs clear to auscultation bilaterally, heart S1-S2 present and rhythmic, the abdomen soft nontender, no lower extremity edema.Patient had diminished fill vision in the right eye.  Patient was admitted to the hospital with the working diagnosis of right optic neuritis   Assessment & Plan:   Principal Problem:   Optic neuritis, right Active Problems:   Nicotine abuse   1. Optic neuritis.Continue  IV IG infusion, #3/5, continue to notice improvement in her vision. Out of bed as tolerated. Follow closely renal function.  2. Tobacco abuse.Continue smoking cessation counseling.   3. Constipation. Milk of Mag as needed.   DVT prophylaxis:enoxaparin Code Status:full Family Communication:no family at the bedside   Disposition Plan:home when completion of iv IG.   Consultants:  Neurology  Procedures:    Antimicrobials    Subjective: Right vision continue to improve, no headache, diplopia or dysphagia. No dyspnea, no nausea or vomiting.   Objective: Vitals:   08/26/17 1937 08/27/17 0034 08/27/17 0448 08/27/17 0930  BP: 100/70 (!) 103/54 102/86 122/77  Pulse: (!) 55 61 66 70  Resp: 18 18 18 18   Temp: 98.2 F (36.8 C) 98.3 F (36.8 C) 98.3 F (36.8 C) 98.2 F (36.8 C)  TempSrc: Oral Oral Oral Oral  SpO2: 98% 94% 100% 100%  Weight:      Height:       No intake or output data in the 24 hours ending 08/27/17  1021 Filed Weights   08/19/17 1840  Weight: 65.8 kg (145 lb)    Examination:   General: Not in pain or dyspnea Neurology: Awake and alert, non focal  E ENT: no pallor, no icterus, oral mucosa moist Cardiovascular: No JVD. S1-S2 present, rhythmic, no gallops, rubs, or murmurs. No lower extremity edema. Pulmonary: vesicular breath sounds bilaterally, adequate air movement, no wheezing, rhonchi or rales. Gastrointestinal. Abdomen with no organomegaly, non tender, no rebound or guarding Skin. No rashes Musculoskeletal: no joint deformities     Data Reviewed: I have personally reviewed following labs and imaging studies  CBC: No results for input(s): WBC, NEUTROABS, HGB, HCT, MCV, PLT in the last 168 hours. Basic Metabolic Panel: Recent Labs  Lab 08/26/17 0444 08/27/17 0344  NA 136 137  K 3.8 3.9  CL 104 103  CO2 27 28  GLUCOSE 85 86  BUN 14 15  CREATININE 0.80 0.92  CALCIUM 8.2* 8.6*   GFR: Estimated Creatinine Clearance: 79.8 mL/min (by C-G formula based on SCr of 0.92 mg/dL). Liver Function Tests: No results for input(s): AST, ALT, ALKPHOS, BILITOT, PROT, ALBUMIN in the last 168 hours. No results for input(s): LIPASE, AMYLASE in the last 168 hours. No results for input(s): AMMONIA in the last 168 hours. Coagulation Profile: No results for input(s): INR, PROTIME in the last 168 hours. Cardiac Enzymes: No results for input(s): CKTOTAL, CKMB, CKMBINDEX, TROPONINI in the last 168 hours. BNP (last 3 results) No results for input(s): PROBNP in the last 8760 hours. HbA1C: No results for input(s): HGBA1C in the last 72 hours. CBG: Recent Labs  Lab 08/22/17 0741 08/24/17 0609 08/25/17 0619 08/26/17 0608 08/27/17 0619  GLUCAP 111* 102* 102* 73 84   Lipid Profile: No results for input(s): CHOL, HDL, LDLCALC, TRIG, CHOLHDL, LDLDIRECT in the last 72 hours. Thyroid Function Tests: No results for input(s): TSH, T4TOTAL, FREET4, T3FREE, THYROIDAB in the last 72  hours. Anemia Panel: No results for input(s): VITAMINB12, FOLATE, FERRITIN, TIBC, IRON, RETICCTPCT in the last 72 hours.    Radiology Studies: I have reviewed all of the imaging during this hospital visit personally     Scheduled Meds: . enoxaparin (LOVENOX) injection  40 mg Subcutaneous Q24H  . Immune Globulin 10%  400 mg/kg Intravenous Q24 Hr x 5  . pantoprazole  40 mg Oral Q0600  . sodium chloride flush  3 mL Intravenous Q12H   Continuous Infusions: . sodium chloride       LOS: 8 days        Strummer Canipe Annett Gula, MD [facility] Pager [phone]

## 2017-08-28 LAB — BASIC METABOLIC PANEL
Anion gap: 7 (ref 5–15)
BUN: 11 mg/dL (ref 6–20)
CO2: 28 mmol/L (ref 22–32)
Calcium: 8.8 mg/dL — ABNORMAL LOW (ref 8.9–10.3)
Chloride: 100 mmol/L — ABNORMAL LOW (ref 101–111)
Creatinine, Ser: 0.95 mg/dL (ref 0.44–1.00)
GFR calc Af Amer: >60 mL/min (ref >60)
GFR calc non Af Amer: >60 mL/min (ref >60)
Glucose, Bld: 89 mg/dL (ref 65–99)
Potassium: 4.1 mmol/L (ref 3.5–5.1)
Sodium: 135 mmol/L (ref 135–145)

## 2017-08-28 NOTE — Discharge Summary (Addendum)
Physician Discharge Summary  Jennifer [name]:[id number] DOB: 1991/03/25 DOA: 08/19/2017  PCP: Patient, No Pcp Per  Admit date: 08/19/2017 Discharge date: 08/29/2017  Admitted From: Home Disposition:  home  Recommendations for Outpatient Follow-up and new medication changes:  1. Follow up with PCP in 1- week 2. Follow up with Neurology as outpatient with Dr. Epimenio Foot at [facility] neurology in one to two weeks.  3. Patient will be discharge 08/29/2017 after last dose of IV Ig.   Home Health: no   Equipment/Devices: no    Discharge Condition: stable  CODE STATUS: Full  Diet recommendation: Regular diet.   Brief/Interim Summary: 27 year old female who presented with headache and right eye vision loss. Patient does have the significant past medical history for tobacco abuse. Patient reported 7-day history of severe right-sided headache, associated with progressive right eye visual loss. On the initial physical examination blood pressure 117/69, heart rate 88, respirate rate 18, temperature 98.8, oxygen saturation 100%.Moist mucous membranes, lungs clear to auscultation bilaterally, heart S1-S2 present and rhythmic, the abdomen soft nontender, no lower extremity edema.Patient had diminished  vision in the right eye.  Sodium 138, potassium 4.2, chloride 104, bicarb 24, glucose 95, BUN 8, creatinine 0.79, white count 5.1, hemoglobin 14.2, hematocrit 44.3, platelets 181.  Urine analysis negative for infection.  Urine pregnancy test negative.  Head CT was a normal study.  Brain MRI with approximate 5 total left T2/flair signal abnormalities involving the supratentorial cerebral white matter, right optic nerve enlargement, edema, restricted diffusion and enhancement.  Findings consistent with acute optic neuritis.  Normal cervical spine MRI.   Patient was admitted to the hospital with the working diagnosis of right optic neuritis.   1.  Right optic neuritis.  Patient was admitted to the medical  ward, patient was treated with high-dose, pulsed dose, IV steroids for 5 days, with no improvement of her symptoms.  Then she received IV Ig for another 5 days with improvement of her vision.  Neuromyelitis optica antibodies were negative.  She does have a high likelihood of multiple sclerosis, she would follow-up with Dr. Epimenio Foot at [facility] neurology.  Patient was seen by physical therapy and occupational therapy with outpatient follow-up recommended.   2.  Tobacco abuse.  Patient received smoking cessation counseling.  3.  Constipation.  Resolved with medical therapy.  Discharge Diagnoses:  Principal Problem:   Optic neuritis, right Active Problems:   Nicotine abuse    Discharge Instructions   Allergies as of 08/28/2017   No Known Allergies     Medication List    TAKE these medications   acetaminophen 500 MG tablet Commonly known as:  TYLENOL Take 1,000 mg by mouth every 6 (six) hours as needed for headache.   aspirin-acetaminophen-caffeine 250-250-65 MG tablet Commonly known as:  EXCEDRIN MIGRAINE Take 2 tablets by mouth every 6 (six) hours as needed for headache or migraine.   diphenhydrAMINE 25 MG tablet Commonly known as:  BENADRYL Take 25 mg by mouth daily as needed (headache).   EMERGEN-C VITAMIN C Pack Take 1 packet by mouth daily as needed (immune system boost).      Follow-up Information    [facility] Follow up.   Specialty:  Rehabilitation Why:  please call to schedule appointment Contact information: [address] [id number] [place] [address] [phone]       Benita Stabile, MD Follow up.   Specialty:  Internal Medicine Why:  appointment scheduled on 09/05/2017 at 3:30  pm Contact information: [address] [place] [address] [phone]          No Known Allergies  Consultations:  Neurology    Procedures/Studies: Ct Head Wo Contrast  Result Date:  08/19/2017 CLINICAL DATA:  Headache EXAM: CT HEAD WITHOUT CONTRAST TECHNIQUE: Contiguous axial images were obtained from the base of the skull through the vertex without intravenous contrast. COMPARISON:  None. FINDINGS: Brain: No acute intracranial abnormality. Specifically, no hemorrhage, hydrocephalus, mass lesion, acute infarction, or significant intracranial injury. Vascular: No hyperdense vessel or unexpected calcification. Skull: No acute calvarial abnormality. Sinuses/Orbits: Visualized paranasal sinuses and mastoids clear. Orbital soft tissues unremarkable. Other: None IMPRESSION: Normal study. Electronically Signed   By: Charlett Nose M.D.   On: 08/19/2017 09:14   Mr Brain Wo Contrast  Result Date: 08/19/2017 CLINICAL DATA:  Severe headache.  RIGHT eye diminished vision. EXAM: MRI HEAD WITHOUT CONTRAST TECHNIQUE: Multiplanar, multiecho pulse sequences of the brain and surrounding structures were obtained without intravenous contrast. COMPARISON:  CT head earlier today. FINDINGS: Brain: No evidence for acute infarction, hemorrhage, mass lesion, hydrocephalus, or extra-axial fluid. Normal for age cerebral volume. Three to four small foci, subcortical and periventricular white matter signal abnormality, nonspecific, but abnormal for age. Considerations include complicated migraine, vasculitis, chronic infection, demyelinating disease, or idiopathic. No similar findings in the brainstem or cerebellum. Vascular: Flow voids are maintained throughout the carotid, basilar, and vertebral arteries. There are no areas of chronic hemorrhage. Skull and upper cervical spine: Normal marrow signal. No upper cervical disc pathology. Mild nasopharyngeal adenoidal hypertrophy, nonspecific. Sinuses/Orbits: Paranasal sinuses are clear. The RIGHT optic nerve is twice the size of the LEFT, and demonstrates T2 hyperintensity with restricted diffusion. Findings are consistent with RIGHT optic neuritis. Other: Compared with prior  CT, the RIGHT optic nerve is confirmed to be enlarged. IMPRESSION: RIGHT optic nerve enlargement, T2 hyperintensity, and restricted diffusion consistent with optic neuritis. Small supratentorial white matter lesions, atypical for demyelinating disease; only one is periventricular. A call has been placed to the ordering provider. Electronically Signed   By: Elsie Stain M.D.   On: 08/19/2017 11:54   Mr Laqueta Jean OZ Contrast  Result Date: 08/19/2017 CLINICAL DATA:  Initial evaluation for acute visual loss, severe headache. Abnormal brain MRI earlier today. EXAM: MRI HEAD/ORBITS WITHOUT AND WITH CONTRAST MRI CERVICAL SPINE WITHOUT AND WITH CONTRAST TECHNIQUE: Multiplanar, multiecho pulse sequences of the brain and surrounding structures, and cervical spine, to include the craniocervical junction and cervicothoracic junction, were obtained without and with intravenous contrast. CONTRAST:  13mL MULTIHANCE GADOBENATE DIMEGLUMINE 529 MG/ML IV SOLN COMPARISON:  Previous MRI from earlier the same day. FINDINGS: MRI HEAD FINDINGS Brain: Cerebral volume within normal limits. Approximate 5 total subcentimeter T2/FLAIR hyperintense foci again noted involving the supratentorial cerebral white matter, mild in nature, but abnormal for age. Only 1 of these is periventricular in location (series 14, image 18). No associated diffusion abnormality or enhancement identified. No foci seen involving the brainstem or cerebellum. No other focal parenchymal signal abnormality. No evidence for acute infarct. Gray-white matter differentiation maintained. No foci of susceptibility artifact to suggest acute or chronic intracranial hemorrhage. No mass lesion, midline shift or mass effect. No hydrocephalus. No extra-axial fluid collection. Major dural sinuses grossly patent. No abnormal enhancement within the brain itself. Pituitary gland within normal limits. Suprasellar region normal. Midline structures intact and normal. Vascular: Major  intracranial vascular flow voids are well maintained. Skull and upper cervical spine: Craniocervical junction within normal limits. Bone  marrow signal intensity normal. No scalp soft tissue abnormality. Other: No mastoid effusion.  Inner ear structures normal. MRI ORBIT FINDINGS Orbits: Globes are symmetric in size with normal morphology and appearance bilaterally. Right optic nerve is enlarged as compared to the left with associated intra neural edema and restricted diffusion. Optic discs seen bulging at the posterior aspect of the globe. Avid postcontrast enhancement seen throughout the majority of the intraorbital portion of the nerve, consistent with acute optic neuritis. Surrounding perineural inflammatory stranding. Relative sparing of the pre chiasmatic segment. Optic chiasm itself is normal in appearance. Left optic nerve within normal limits without edema or enhancement. Remainder of the intraorbital contents including the extraocular muscles, superior orbital veins, and lacrimal glands are normal in appearance. Sinuses: Visualized paranasal sinuses are clear. Soft tissues: Periorbital soft tissues within normal limits. MRI CERVICAL SPINE FINDINGS Alignment: Straightening of the normal cervical lordosis. No listhesis. Vertebrae: Vertebral body heights maintained without evidence for acute or chronic fracture. Bone marrow signal intensity within normal limits. No discrete or worrisome osseous lesions. No abnormal marrow edema or enhancement. Cord: Signal intensity within the cervical spinal cord is normal. No cord signal abnormality to suggest demyelinating disease. No abnormal cord expansion or edema. No abnormal enhancement. Posterior Fossa, vertebral arteries, paraspinal tissues: Paraspinous and prevertebral soft tissues within normal limits. Normal intravascular flow voids present within the vertebral arteries bilaterally. Disc levels: No significant disc pathology seen within the cervical spine. No disc  bulge or disc protrusion. No significant canal or foraminal stenosis. IMPRESSION: MRI BRAIN IMPRESSION 1. Approximate 5 total T2/FLAIR signal abnormality involving the supratentorial cerebral white matter, nonspecific, but perhaps most concerning for possible demyelinating disease given the right optic nerve findings. No associated diffusion abnormality or enhancement to suggest active demyelination. Differential considerations include sequelae of complicated migraine, vasculitis, or other chronic infectious or inflammatory process. 2. Otherwise normal brain MRI. MRI ORBIT IMPRESSION 1. Right optic nerve enlargement, edema, restricted diffusion, and enhancement. Findings consistent with acute optic neuritis. 2. Otherwise normal MRI of the orbits. MRI CERVICAL SPINE IMPRESSION Normal MRI of the cervical spinal cord. No cord signal abnormality to suggest demyelinating disease or other acute abnormality. No significant disc pathology or stenosis. Electronically Signed   By: Rise Mu M.D.   On: 08/19/2017 23:38   Mr Cervical Spine W Or Wo Contrast  Result Date: 08/19/2017 CLINICAL DATA:  Initial evaluation for acute visual loss, severe headache. Abnormal brain MRI earlier today. EXAM: MRI HEAD/ORBITS WITHOUT AND WITH CONTRAST MRI CERVICAL SPINE WITHOUT AND WITH CONTRAST TECHNIQUE: Multiplanar, multiecho pulse sequences of the brain and surrounding structures, and cervical spine, to include the craniocervical junction and cervicothoracic junction, were obtained without and with intravenous contrast. CONTRAST:  52mL MULTIHANCE GADOBENATE DIMEGLUMINE 529 MG/ML IV SOLN COMPARISON:  Previous MRI from earlier the same day. FINDINGS: MRI HEAD FINDINGS Brain: Cerebral volume within normal limits. Approximate 5 total subcentimeter T2/FLAIR hyperintense foci again noted involving the supratentorial cerebral white matter, mild in nature, but abnormal for age. Only 1 of these is periventricular in location (series  14, image 18). No associated diffusion abnormality or enhancement identified. No foci seen involving the brainstem or cerebellum. No other focal parenchymal signal abnormality. No evidence for acute infarct. Gray-white matter differentiation maintained. No foci of susceptibility artifact to suggest acute or chronic intracranial hemorrhage. No mass lesion, midline shift or mass effect. No hydrocephalus. No extra-axial fluid collection. Major dural sinuses grossly patent. No abnormal enhancement within the brain itself. Pituitary gland within normal  limits. Suprasellar region normal. Midline structures intact and normal. Vascular: Major intracranial vascular flow voids are well maintained. Skull and upper cervical spine: Craniocervical junction within normal limits. Bone marrow signal intensity normal. No scalp soft tissue abnormality. Other: No mastoid effusion.  Inner ear structures normal. MRI ORBIT FINDINGS Orbits: Globes are symmetric in size with normal morphology and appearance bilaterally. Right optic nerve is enlarged as compared to the left with associated intra neural edema and restricted diffusion. Optic discs seen bulging at the posterior aspect of the globe. Avid postcontrast enhancement seen throughout the majority of the intraorbital portion of the nerve, consistent with acute optic neuritis. Surrounding perineural inflammatory stranding. Relative sparing of the pre chiasmatic segment. Optic chiasm itself is normal in appearance. Left optic nerve within normal limits without edema or enhancement. Remainder of the intraorbital contents including the extraocular muscles, superior orbital veins, and lacrimal glands are normal in appearance. Sinuses: Visualized paranasal sinuses are clear. Soft tissues: Periorbital soft tissues within normal limits. MRI CERVICAL SPINE FINDINGS Alignment: Straightening of the normal cervical lordosis. No listhesis. Vertebrae: Vertebral body heights maintained without  evidence for acute or chronic fracture. Bone marrow signal intensity within normal limits. No discrete or worrisome osseous lesions. No abnormal marrow edema or enhancement. Cord: Signal intensity within the cervical spinal cord is normal. No cord signal abnormality to suggest demyelinating disease. No abnormal cord expansion or edema. No abnormal enhancement. Posterior Fossa, vertebral arteries, paraspinal tissues: Paraspinous and prevertebral soft tissues within normal limits. Normal intravascular flow voids present within the vertebral arteries bilaterally. Disc levels: No significant disc pathology seen within the cervical spine. No disc bulge or disc protrusion. No significant canal or foraminal stenosis. IMPRESSION: MRI BRAIN IMPRESSION 1. Approximate 5 total T2/FLAIR signal abnormality involving the supratentorial cerebral white matter, nonspecific, but perhaps most concerning for possible demyelinating disease given the right optic nerve findings. No associated diffusion abnormality or enhancement to suggest active demyelination. Differential considerations include sequelae of complicated migraine, vasculitis, or other chronic infectious or inflammatory process. 2. Otherwise normal brain MRI. MRI ORBIT IMPRESSION 1. Right optic nerve enlargement, edema, restricted diffusion, and enhancement. Findings consistent with acute optic neuritis. 2. Otherwise normal MRI of the orbits. MRI CERVICAL SPINE IMPRESSION Normal MRI of the cervical spinal cord. No cord signal abnormality to suggest demyelinating disease or other acute abnormality. No significant disc pathology or stenosis. Electronically Signed   By: Rise Mu M.D.   On: 08/19/2017 23:38   Mr Rockwell [place] ZO Contrast  Result Date: 08/19/2017 CLINICAL DATA:  Initial evaluation for acute visual loss, severe headache. Abnormal brain MRI earlier today. EXAM: MRI HEAD/ORBITS WITHOUT AND WITH CONTRAST MRI CERVICAL SPINE WITHOUT AND WITH CONTRAST  TECHNIQUE: Multiplanar, multiecho pulse sequences of the brain and surrounding structures, and cervical spine, to include the craniocervical junction and cervicothoracic junction, were obtained without and with intravenous contrast. CONTRAST:  13mL MULTIHANCE GADOBENATE DIMEGLUMINE 529 MG/ML IV SOLN COMPARISON:  Previous MRI from earlier the same day. FINDINGS: MRI HEAD FINDINGS Brain: Cerebral volume within normal limits. Approximate 5 total subcentimeter T2/FLAIR hyperintense foci again noted involving the supratentorial cerebral white matter, mild in nature, but abnormal for age. Only 1 of these is periventricular in location (series 14, image 18). No associated diffusion abnormality or enhancement identified. No foci seen involving the brainstem or cerebellum. No other focal parenchymal signal abnormality. No evidence for acute infarct. Gray-white matter differentiation maintained. No foci of susceptibility artifact to suggest acute or chronic intracranial hemorrhage. No mass lesion,  midline shift or mass effect. No hydrocephalus. No extra-axial fluid collection. Major dural sinuses grossly patent. No abnormal enhancement within the brain itself. Pituitary gland within normal limits. Suprasellar region normal. Midline structures intact and normal. Vascular: Major intracranial vascular flow voids are well maintained. Skull and upper cervical spine: Craniocervical junction within normal limits. Bone marrow signal intensity normal. No scalp soft tissue abnormality. Other: No mastoid effusion.  Inner ear structures normal. MRI ORBIT FINDINGS Orbits: Globes are symmetric in size with normal morphology and appearance bilaterally. Right optic nerve is enlarged as compared to the left with associated intra neural edema and restricted diffusion. Optic discs seen bulging at the posterior aspect of the globe. Avid postcontrast enhancement seen throughout the majority of the intraorbital portion of the nerve, consistent  with acute optic neuritis. Surrounding perineural inflammatory stranding. Relative sparing of the pre chiasmatic segment. Optic chiasm itself is normal in appearance. Left optic nerve within normal limits without edema or enhancement. Remainder of the intraorbital contents including the extraocular muscles, superior orbital veins, and lacrimal glands are normal in appearance. Sinuses: Visualized paranasal sinuses are clear. Soft tissues: Periorbital soft tissues within normal limits. MRI CERVICAL SPINE FINDINGS Alignment: Straightening of the normal cervical lordosis. No listhesis. Vertebrae: Vertebral body heights maintained without evidence for acute or chronic fracture. Bone marrow signal intensity within normal limits. No discrete or worrisome osseous lesions. No abnormal marrow edema or enhancement. Cord: Signal intensity within the cervical spinal cord is normal. No cord signal abnormality to suggest demyelinating disease. No abnormal cord expansion or edema. No abnormal enhancement. Posterior Fossa, vertebral arteries, paraspinal tissues: Paraspinous and prevertebral soft tissues within normal limits. Normal intravascular flow voids present within the vertebral arteries bilaterally. Disc levels: No significant disc pathology seen within the cervical spine. No disc bulge or disc protrusion. No significant canal or foraminal stenosis. IMPRESSION: MRI BRAIN IMPRESSION 1. Approximate 5 total T2/FLAIR signal abnormality involving the supratentorial cerebral white matter, nonspecific, but perhaps most concerning for possible demyelinating disease given the right optic nerve findings. No associated diffusion abnormality or enhancement to suggest active demyelination. Differential considerations include sequelae of complicated migraine, vasculitis, or other chronic infectious or inflammatory process. 2. Otherwise normal brain MRI. MRI ORBIT IMPRESSION 1. Right optic nerve enlargement, edema, restricted diffusion, and  enhancement. Findings consistent with acute optic neuritis. 2. Otherwise normal MRI of the orbits. MRI CERVICAL SPINE IMPRESSION Normal MRI of the cervical spinal cord. No cord signal abnormality to suggest demyelinating disease or other acute abnormality. No significant disc pathology or stenosis. Electronically Signed   By: Rise Mu M.D.   On: 08/19/2017 23:38       Subjective: Patient is feeling better vision on the right eye has improved but not back yet to baseline. No nausea or vomiting.  Discharge Exam: Vitals:   08/28/17 0506 08/28/17 0940  BP: (!) 98/58 108/72  Pulse: 67 79  Resp: 18 18  Temp: 98.8 F (37.1 C) 98.3 F (36.8 C)  SpO2: 96% 100%   Vitals:   08/27/17 2016 08/28/17 0041 08/28/17 0506 08/28/17 0940  BP: 101/68 103/67 (!) 98/58 108/72  Pulse: 79 66 67 79  Resp: 18 18 18 18   Temp: 99.1 F (37.3 C) 98.8 F (37.1 C) 98.8 F (37.1 C) 98.3 F (36.8 C)  TempSrc: Oral Oral Oral Oral  SpO2: 98% 98% 96% 100%  Weight:      Height:        General: Not in pain or dyspnea  Neurology: Awake  and alert, non focal  E ENT: no pallor, no icterus, oral mucosa moist Cardiovascular: No JVD. S1-S2 present, rhythmic, no gallops, rubs, or murmurs. No lower extremity edema. Pulmonary: vesicular breath sounds bilaterally, adequate air movement, no wheezing, rhonchi or rales. Gastrointestinal. Abdomen flat, no organomegaly, non tender, no rebound or guarding Skin. No rashes Musculoskeletal: no joint deformities   The results of significant diagnostics from this hospitalization (including imaging, microbiology, ancillary and laboratory) are listed below for reference.     Microbiology: No results found for this or any previous visit (from the past 240 hour(s)).   Labs: BNP (last 3 results) No results for input(s): BNP in the last 8760 hours. Basic Metabolic Panel: Recent Labs  Lab 08/26/17 0444 08/27/17 0344 08/28/17 0526  NA 136 137 135  K 3.8 3.9 4.1   CL 104 103 100*  CO2 27 28 28   GLUCOSE 85 86 89  BUN 14 15 11   CREATININE 0.80 0.92 0.95  CALCIUM 8.2* 8.6* 8.8*   Liver Function Tests: No results for input(s): AST, ALT, ALKPHOS, BILITOT, PROT, ALBUMIN in the last 168 hours. No results for input(s): LIPASE, AMYLASE in the last 168 hours. No results for input(s): AMMONIA in the last 168 hours. CBC: No results for input(s): WBC, NEUTROABS, HGB, HCT, MCV, PLT in the last 168 hours. Cardiac Enzymes: No results for input(s): CKTOTAL, CKMB, CKMBINDEX, TROPONINI in the last 168 hours. BNP: Invalid input(s): POCBNP CBG: Recent Labs  Lab 08/22/17 0741 08/24/17 0609 08/25/17 0619 08/26/17 0608 08/27/17 0619  GLUCAP 111* 102* 102* 73 84   D-Dimer No results for input(s): DDIMER in the last 72 hours. Hgb A1c No results for input(s): HGBA1C in the last 72 hours. Lipid Profile No results for input(s): CHOL, HDL, LDLCALC, TRIG, CHOLHDL, LDLDIRECT in the last 72 hours. Thyroid function studies No results for input(s): TSH, T4TOTAL, T3FREE, THYROIDAB in the last 72 hours.  Invalid input(s): FREET3 Anemia work up No results for input(s): VITAMINB12, FOLATE, FERRITIN, TIBC, IRON, RETICCTPCT in the last 72 hours. Urinalysis    Component Value Date/Time   COLORURINE YELLOW 08/19/2017 0956   APPEARANCEUR CLEAR 08/19/2017 0956   LABSPEC 1.018 08/19/2017 0956   PHURINE 6.0 08/19/2017 0956   GLUCOSEU NEGATIVE 08/19/2017 0956   HGBUR NEGATIVE 08/19/2017 0956   BILIRUBINUR NEGATIVE 08/19/2017 0956   KETONESUR 20 (A) 08/19/2017 0956   PROTEINUR NEGATIVE 08/19/2017 0956   UROBILINOGEN 0.2 08/04/2012 0750   NITRITE NEGATIVE 08/19/2017 0956   LEUKOCYTESUR NEGATIVE 08/19/2017 0956   Sepsis Labs Invalid input(s): PROCALCITONIN,  WBC,  LACTICIDVEN Microbiology No results found for this or any previous visit (from the past 240 hour(s)).   Time coordinating discharge: 45 minutes  SIGNED:   Coralie Keens, MD  [facility] 08/28/2017, 11:41 AM Pager [phone]  If 7PM-7AM, please contact night-coverage [email or web address] Password [email or web address]

## 2017-08-29 LAB — GLUCOSE, CAPILLARY: Glucose-Capillary: 82 mg/dL (ref 65–99)

## 2017-08-29 NOTE — Progress Notes (Signed)
Per patient request iv was removed from right a/c.  Pt says it is uncomfortable and she cannot rest with it in her arm.  Patient requested iv to be removed and if one is needed again in the morning they can reinsert it at that time.  RN removed iv and on call notified.

## 2017-08-29 NOTE — Progress Notes (Signed)
PROGRESS NOTE    Jennifer [name]  VEL:[id number] DOB: 1991-01-25 DOA: 08/19/2017 PCP: Patient, No Pcp Per    Brief Narrative:  27 year old female who presented with headache and right eye vision loss. Patient does have the significant past medical history for tobacco abuse. Patient reported 7-day history of severe right-sided headache, associated with progressive right eye visual loss. On the initial physical examination blood pressure 117/69, heart rate 88, respirate rate 18, temperature 98.8, oxygen saturation 100%.Moist mucous membranes, lungs clear to auscultation bilaterally, heart S1-S2 present and rhythmic, the abdomen soft nontender, no lower extremity edema.Patient had diminished  vision in the right eye.  Sodium 138, potassium 4.2, chloride 104, bicarb 24, glucose 95, BUN 8, creatinine 0.79, white count 5.1, hemoglobin 14.2, hematocrit 44.3, platelets 181.  Urine analysis negative for infection.  Urine pregnancy test negative.  Head CT was a normal study.  Brain MRI with approximate 5 total left T2/flair signal abnormalities involving the supratentorial cerebral white matter, right optic nerve enlargement, edema, restricted diffusion and enhancement.  Findings consistent with acute optic neuritis.  Normal cervical spine MRI.   Patient was admitted to the hospital with the working diagnosis of right optic neuritis.   Assessment & Plan:   Principal Problem:   Optic neuritis, right Active Problems:   Nicotine abuse  1.  Right optic neuritis.  Patient will have his last dose of IV Ig today, will continue observation overnight and reassess in am. If no neurologic improvement, may need plasma exchange, that will require further hospitalization. Case discussed with Dr. Wilford Corner from neurology. Continue neuro checks per unit protocol.   2.  Tobacco abuse. Smoking cessation.  3.  Constipation.  Resolved with medical therapy.  DVT prophylaxis: out of bed   Code Status:  full Family  Communication: no family at the bedside  Disposition Plan: home in am if clinical improvement, pending neurology recommendations.    Consultants:   Neurology   Procedures:     Antimicrobials:       Subjective: Patient is feeling well, vision on the right eye is stable, positive shadows, no worsening or improving.   Objective: Vitals:   08/28/17 2341 08/29/17 0444 08/29/17 0745 08/29/17 1215  BP: 105/65 109/70 117/84 108/77  Pulse: 69 69 75 95  Resp: 18 18 18 18   Temp: 98.6 F (37 C) 98.1 F (36.7 C) 98.5 F (36.9 C) 99.2 F (37.3 C)  TempSrc: Oral Oral Oral Oral  SpO2: 99% 97% 97% 99%  Weight:      Height:        Intake/Output Summary (Last 24 hours) at 08/29/2017 1240 Last data filed at 08/29/2017 0600 Gross per 24 hour  Intake 150 ml  Output -  Net 150 ml   Filed Weights   08/19/17 1840  Weight: 65.8 kg (145 lb)    Examination:   General: Not in pain or dyspnea.  Neurology: Awake and alert, non focal  E ENT: no pallor, no icterus, oral mucosa moist Cardiovascular: No JVD. S1-S2 present, rhythmic, no gallops, rubs, or murmurs. No lower extremity edema. Pulmonary: vesicular breath sounds bilaterally, adequate air movement, no wheezing, rhonchi or rales. Gastrointestinal. Abdomen with no organomegaly, non tender, no rebound or guarding Skin. No rashes Musculoskeletal: no joint deformities     Data Reviewed: I have personally reviewed following labs and imaging studies  CBC: No results for input(s): WBC, NEUTROABS, HGB, HCT, MCV, PLT in the last 168 hours. Basic Metabolic Panel: Recent Labs  Lab 08/26/17 0444  08/27/17 0344 08/28/17 0526  NA 136 137 135  K 3.8 3.9 4.1  CL 104 103 100*  CO2 27 28 28   GLUCOSE 85 86 89  BUN 14 15 11   CREATININE 0.80 0.92 0.95  CALCIUM 8.2* 8.6* 8.8*   GFR: Estimated Creatinine Clearance: 77.2 mL/min (by C-G formula based on SCr of 0.95 mg/dL). Liver Function Tests: No results for input(s): AST, ALT,  ALKPHOS, BILITOT, PROT, ALBUMIN in the last 168 hours. No results for input(s): LIPASE, AMYLASE in the last 168 hours. No results for input(s): AMMONIA in the last 168 hours. Coagulation Profile: No results for input(s): INR, PROTIME in the last 168 hours. Cardiac Enzymes: No results for input(s): CKTOTAL, CKMB, CKMBINDEX, TROPONINI in the last 168 hours. BNP (last 3 results) No results for input(s): PROBNP in the last 8760 hours. HbA1C: No results for input(s): HGBA1C in the last 72 hours. CBG: Recent Labs  Lab 08/24/17 0609 08/25/17 0619 08/26/17 0608 08/27/17 0619 08/29/17 0743  GLUCAP 102* 102* 73 84 82   Lipid Profile: No results for input(s): CHOL, HDL, LDLCALC, TRIG, CHOLHDL, LDLDIRECT in the last 72 hours. Thyroid Function Tests: No results for input(s): TSH, T4TOTAL, FREET4, T3FREE, THYROIDAB in the last 72 hours. Anemia Panel: No results for input(s): VITAMINB12, FOLATE, FERRITIN, TIBC, IRON, RETICCTPCT in the last 72 hours.    Radiology Studies: I have reviewed all of the imaging during this hospital visit personally     Scheduled Meds: . enoxaparin (LOVENOX) injection  40 mg Subcutaneous Q24H  . Immune Globulin 10%  400 mg/kg Intravenous Q24 Hr x 5  . pantoprazole  40 mg Oral Q0600  . sodium chloride flush  3 mL Intravenous Q12H   Continuous Infusions: . sodium chloride       LOS: 10 days        Jennifer Breed Annett Gula, MD [facility] Pager [phone]

## 2017-08-29 NOTE — Progress Notes (Addendum)
Neuro hospitalist progress note  Subjective: Patient awake and alert in bed. No improvement in vision since yesterday.  Exam: Vitals:   08/29/17 0444 08/29/17 0745  BP: 109/70 117/84  Pulse: 69 75  Resp: 18 18  Temp: 98.1 F (36.7 C) 98.5 F (36.9 C)  SpO2: 97% 97%  Gen: WD WN NAD CVS: S1S2+ HEENT: [place] AT RESP: CTABL Neurological exam:  Mental Status: Alert and oriented. Speech intact. Cranial Nerves: Right afferent pupillary response present. Left eye normal vision. Right eye can see things moving with bright lights present. Unable to distinguish objects. No diplopia noted. EOMI intact, but right eye still somewhat painful with lateral eye movements. Face symmetric. Motor: 5/5 in all extremities Sensory: Intact to light touch all over Cerebellar: Normal; no ataxia noted  Medications:  Scheduled: . enoxaparin (LOVENOX) injection  40 mg Subcutaneous Q24H  . Immune Globulin 10%  400 mg/kg Intravenous Q24 Hr x 5  . pantoprazole  40 mg Oral Q0600  . sodium chloride flush  3 mL Intravenous Q12H   Continuous: . sodium chloride     RZN:BVAPOL chloride, acetaminophen, magnesium hydroxide, ondansetron, sodium chloride flush  Pertinent Labs/Diagnostics:  No labs today   Valentina Lucks, NP-C [facility] [phone]  Attending addendum Patient seen and examined. Agree with the history physical documented above I have reviewed the MRI brain - MRI positive for enhancement of right optic nerve and some FLAIR WM lesions MRI c-spine unremarkable. NMO Ab negative  Impression:  27 year old with right optic neuritis. She has been given 5 days of IV steroids high-dose with no response. She was started on IVIG with the last dose being completed today. She does not show much improvement in terms of her visual acuity. Given her young age and significant disability with visual loss, I would suggest pursuing plasma exchange. Plasma exchange can be done either now or she  can wait for a week or so to see how she responds to the IVIG and then consider plasma change. NMO Ab negative.  Recommendations: Continue last dose of IVIG PT OT Outpatient follow-up with Dr. Epimenio Foot in 1 to 2 weeks for initiation of disease modifying therapy as an outpatient We will discuss with the primary hospitalist the plan above regarding plasma exchange. Patient was under impression of potenital discharge today, but that might Discussed the plan in detail with the patient.  Answered all questions.   08/29/2017, 9:20 AM -- Milon Dikes, MD [facility] Pager: [phone] If 7pm to 7am, please call on call as listed on AMION.

## 2017-08-30 ENCOUNTER — Inpatient Hospital Stay (HOSPITAL_COMMUNITY): Payer: BLUE CROSS/BLUE SHIELD

## 2017-08-30 ENCOUNTER — Encounter (HOSPITAL_COMMUNITY): Payer: Self-pay | Admitting: Student

## 2017-08-30 HISTORY — PX: IR FLUORO GUIDE CV LINE RIGHT: IMG2283

## 2017-08-30 HISTORY — PX: IR US GUIDE VASC ACCESS RIGHT: IMG2390

## 2017-08-30 LAB — CBC WITH DIFFERENTIAL/PLATELET
Abs Immature Granulocytes: 0 10*3/uL (ref 0.0–0.1)
Basophils Absolute: 0 10*3/uL (ref 0.0–0.1)
Basophils Relative: 0 %
Eosinophils Absolute: 0.1 10*3/uL (ref 0.0–0.7)
Eosinophils Relative: 2 %
HCT: 44.2 % (ref 36.0–46.0)
Hemoglobin: 14.4 g/dL (ref 12.0–15.0)
Immature Granulocytes: 1 %
Lymphocytes Relative: 29 %
Lymphs Abs: 1.4 10*3/uL (ref 0.7–4.0)
MCH: 27.6 pg (ref 26.0–34.0)
MCHC: 32.6 g/dL (ref 30.0–36.0)
MCV: 84.7 fL (ref 78.0–100.0)
Monocytes Absolute: 0.7 10*3/uL (ref 0.1–1.0)
Monocytes Relative: 13 %
Neutro Abs: 2.8 10*3/uL (ref 1.7–7.7)
Neutrophils Relative %: 55 %
Platelets: 151 10*3/uL (ref 150–400)
RBC: 5.22 MIL/uL — ABNORMAL HIGH (ref 3.87–5.11)
RDW: 13.3 % (ref 11.5–15.5)
WBC: 5 10*3/uL (ref 4.0–10.5)

## 2017-08-30 LAB — GLUCOSE, CAPILLARY: Glucose-Capillary: 81 mg/dL (ref 65–99)

## 2017-08-30 LAB — PROTIME-INR
INR: 0.96
Prothrombin Time: 12.7 seconds (ref 11.4–15.2)

## 2017-08-30 MED ORDER — GELATIN ABSORBABLE 12-7 MM EX MISC
CUTANEOUS | Status: AC
  Filled 2017-08-30: qty 1

## 2017-08-30 MED ORDER — HEPARIN SODIUM (PORCINE) 1000 UNIT/ML IJ SOLN
INTRAMUSCULAR | Status: AC
  Administered 2017-08-30: 16:00:00
  Filled 2017-08-30: qty 1

## 2017-08-30 MED ORDER — CEFAZOLIN SODIUM-DEXTROSE 2-4 GM/100ML-% IV SOLN
2.0000 g | Freq: Once | INTRAVENOUS | Status: DC
  Filled 2017-08-30: qty 100

## 2017-08-30 MED ORDER — FENTANYL CITRATE (PF) 100 MCG/2ML IJ SOLN
INTRAMUSCULAR | Status: AC | PRN
  Administered 2017-08-30: 50 ug via INTRAVENOUS
  Administered 2017-08-30: 12.5 ug via INTRAVENOUS

## 2017-08-30 MED ORDER — MIDAZOLAM HCL 2 MG/2ML IJ SOLN
INTRAMUSCULAR | Status: AC
  Filled 2017-08-30: qty 2

## 2017-08-30 MED ORDER — CEFAZOLIN SODIUM-DEXTROSE 2-4 GM/100ML-% IV SOLN
INTRAVENOUS | Status: AC
  Filled 2017-08-30: qty 100

## 2017-08-30 MED ORDER — FENTANYL CITRATE (PF) 100 MCG/2ML IJ SOLN
INTRAMUSCULAR | Status: AC
  Filled 2017-08-30: qty 2

## 2017-08-30 MED ORDER — LIDOCAINE HCL 1 % IJ SOLN
INTRAMUSCULAR | Status: AC
  Filled 2017-08-30: qty 20

## 2017-08-30 MED ORDER — MIDAZOLAM HCL 2 MG/2ML IJ SOLN
INTRAMUSCULAR | Status: AC | PRN
Start: 2017-08-30 — End: 2017-08-30
  Administered 2017-08-30: 0.5 mg via INTRAVENOUS
  Administered 2017-08-30: 1 mg via INTRAVENOUS

## 2017-08-30 MED ORDER — ENOXAPARIN SODIUM 40 MG/0.4ML ~~LOC~~ SOLN
40.0000 mg | SUBCUTANEOUS | Status: DC
  Filled 2017-08-30: qty 0.4

## 2017-08-30 MED ORDER — LIDOCAINE HCL (PF) 1 % IJ SOLN
INTRAMUSCULAR | Status: AC | PRN
  Administered 2017-08-30: 8 mL

## 2017-08-30 NOTE — Consult Note (Addendum)
Chief Complaint: Patient was seen in consultation today for optic neuritis  Referring Physician(s): Dr. Wilford Corner  Supervising Physician: Ruel Favors  Patient Status: [facility] - In-pt  History of Present Illness: Jennifer [name] is a 27 y.o. female smoker who presented to [facility] ED with vision changes and headache.  She is found to have optic neuritis.  She was admitted and initiated IV steroids without change.  She has also completed a course of IVIG, however visual field deficits remain. She is now planning for plasma exchanges which are to start as inpatient and be completed as outpatient.  IR consulted for tunneled plasmapheresis catheter placement.   Patient last ate at 8AM.  Her last dose of lovenox was 11PM 5/12.  Past Medical History:  Diagnosis Date  . Ectopic pregnancy    MTX 07/2012    Past Surgical History:  Procedure Laterality Date  . NO PAST SURGERIES      Allergies: Patient has no known allergies.  Medications: Prior to Admission medications   Medication Sig Start Date End Date Taking? Authorizing Provider  acetaminophen (TYLENOL) 500 MG tablet Take 1,000 mg by mouth every 6 (six) hours as needed for headache.   Yes [provider]  aspirin-acetaminophen-caffeine (EXCEDRIN MIGRAINE) [phone] MG tablet Take 2 tablets by mouth every 6 (six) hours as needed for headache or migraine.   Yes [provider]  diphenhydrAMINE (BENADRYL) 25 MG tablet Take 25 mg by mouth daily as needed (headache).   Yes [provider]  Multiple Vitamins-Minerals (EMERGEN-C VITAMIN C) PACK Take 1 packet by mouth daily as needed (immune system boost).   Yes [provider]     Family History  Problem Relation Age of Onset  . Cancer Maternal Grandmother   . Heart disease Maternal Grandmother   . Breast cancer Maternal Grandmother   . Cancer Maternal Grandfather   . Fibromyalgia Mother   . Other Mother        concussion  . Other Sister    pre-eclampsia  . Lupus Sister   . Kidney disease Sister   . Asthma Son     Social History   Socioeconomic History  . Marital status: Married    Spouse name: Not on file  . Number of children: Not on file  . Years of education: Not on file  . Highest education level: Not on file  Occupational History  . Not on file  Social Needs  . Financial resource strain: Not on file  . Food insecurity:    Worry: Not on file    Inability: Not on file  . Transportation needs:    Medical: Not on file    Non-medical: Not on file  Tobacco Use  . Smoking status: Current Every Day Smoker    Packs/day: 0.50    Years: 10.00    Pack years: 5.00    Types: Cigarettes  . Smokeless tobacco: Never Used  Substance and Sexual Activity  . Alcohol use: Yes    Alcohol/week: 4.2 oz    Types: 4 Cans of beer, 3 Shots of liquor per week    Comment: twice a month  . Drug use: Yes    Types: Marijuana    Comment: once daily  . Sexual activity: Yes    Birth control/protection: None  Lifestyle  . Physical activity:    Days per week: Not on file    Minutes per session: Not on file  . Stress: Not on file  Relationships  . Social  connections:    Talks on phone: Not on file    Gets together: Not on file    Attends religious service: Not on file    Active member of club or organization: Not on file    Attends meetings of clubs or organizations: Not on file    Relationship status: Not on file  Other Topics Concern  . Not on file  Social History Narrative  . Not on file     Review of Systems: A 12 point ROS discussed and pertinent positives are indicated in the HPI above.  All other systems are negative.  Review of Systems  Constitutional: Negative for fatigue and fever.  Eyes: Positive for visual disturbance.  Respiratory: Negative for cough and shortness of breath.   Cardiovascular: Negative for chest pain.  Gastrointestinal: Negative for abdominal pain.  Musculoskeletal: Negative for back pain.   Psychiatric/Behavioral: Negative for behavioral problems and confusion.    Vital Signs: BP 105/72 (BP Location: Left Arm)   Pulse 84   Temp 98.2 F (36.8 C) (Oral)   Resp 18   Ht 5\' 1"  (1.549 m)   Wt 145 lb (65.8 kg)   LMP 08/05/2017   SpO2 96%   BMI 27.40 kg/m   Physical Exam  Constitutional: She is oriented to person, place, and time. She appears well-developed.  Eyes: Conjunctivae and EOM are normal. Right eye exhibits no discharge. Left eye exhibits no discharge.  Cardiovascular: Normal rate, regular rhythm and normal heart sounds.  Pulmonary/Chest: Effort normal and breath sounds normal. No respiratory distress.  Neurological: She is alert and oriented to person, place, and time.  Skin: Skin is warm and dry.  Psychiatric: She has a normal mood and affect. Her behavior is normal. Judgment and thought content normal.  Nursing note and vitals reviewed.    MD Evaluation Airway: WNL Heart: WNL Abdomen: WNL Chest/ Lungs: WNL ASA  Classification: 3 Mallampati/Airway Score: One   Imaging: Ct Head Wo Contrast  Result Date: 08/19/2017 CLINICAL DATA:  Headache EXAM: CT HEAD WITHOUT CONTRAST TECHNIQUE: Contiguous axial images were obtained from the base of the skull through the vertex without intravenous contrast. COMPARISON:  None. FINDINGS: Brain: No acute intracranial abnormality. Specifically, no hemorrhage, hydrocephalus, mass lesion, acute infarction, or significant intracranial injury. Vascular: No hyperdense vessel or unexpected calcification. Skull: No acute calvarial abnormality. Sinuses/Orbits: Visualized paranasal sinuses and mastoids clear. Orbital soft tissues unremarkable. Other: None IMPRESSION: Normal study. Electronically Signed   By: Charlett Nose M.D.   On: 08/19/2017 09:14   Mr Brain Wo Contrast  Result Date: 08/19/2017 CLINICAL DATA:  Severe headache.  RIGHT eye diminished vision. EXAM: MRI HEAD WITHOUT CONTRAST TECHNIQUE: Multiplanar, multiecho pulse  sequences of the brain and surrounding structures were obtained without intravenous contrast. COMPARISON:  CT head earlier today. FINDINGS: Brain: No evidence for acute infarction, hemorrhage, mass lesion, hydrocephalus, or extra-axial fluid. Normal for age cerebral volume. Three to four small foci, subcortical and periventricular white matter signal abnormality, nonspecific, but abnormal for age. Considerations include complicated migraine, vasculitis, chronic infection, demyelinating disease, or idiopathic. No similar findings in the brainstem or cerebellum. Vascular: Flow voids are maintained throughout the carotid, basilar, and vertebral arteries. There are no areas of chronic hemorrhage. Skull and upper cervical spine: Normal marrow signal. No upper cervical disc pathology. Mild nasopharyngeal adenoidal hypertrophy, nonspecific. Sinuses/Orbits: Paranasal sinuses are clear. The RIGHT optic nerve is twice the size of the LEFT, and demonstrates T2 hyperintensity with restricted diffusion. Findings are consistent with RIGHT  optic neuritis. Other: Compared with prior CT, the RIGHT optic nerve is confirmed to be enlarged. IMPRESSION: RIGHT optic nerve enlargement, T2 hyperintensity, and restricted diffusion consistent with optic neuritis. Small supratentorial white matter lesions, atypical for demyelinating disease; only one is periventricular. A call has been placed to the ordering provider. Electronically Signed   By: Elsie Stain M.D.   On: 08/19/2017 11:54   Mr Laqueta Jean ZO Contrast  Result Date: 08/19/2017 CLINICAL DATA:  Initial evaluation for acute visual loss, severe headache. Abnormal brain MRI earlier today. EXAM: MRI HEAD/ORBITS WITHOUT AND WITH CONTRAST MRI CERVICAL SPINE WITHOUT AND WITH CONTRAST TECHNIQUE: Multiplanar, multiecho pulse sequences of the brain and surrounding structures, and cervical spine, to include the craniocervical junction and cervicothoracic junction, were obtained without and  with intravenous contrast. CONTRAST:  13mL MULTIHANCE GADOBENATE DIMEGLUMINE 529 MG/ML IV SOLN COMPARISON:  Previous MRI from earlier the same day. FINDINGS: MRI HEAD FINDINGS Brain: Cerebral volume within normal limits. Approximate 5 total subcentimeter T2/FLAIR hyperintense foci again noted involving the supratentorial cerebral white matter, mild in nature, but abnormal for age. Only 1 of these is periventricular in location (series 14, image 18). No associated diffusion abnormality or enhancement identified. No foci seen involving the brainstem or cerebellum. No other focal parenchymal signal abnormality. No evidence for acute infarct. Gray-white matter differentiation maintained. No foci of susceptibility artifact to suggest acute or chronic intracranial hemorrhage. No mass lesion, midline shift or mass effect. No hydrocephalus. No extra-axial fluid collection. Major dural sinuses grossly patent. No abnormal enhancement within the brain itself. Pituitary gland within normal limits. Suprasellar region normal. Midline structures intact and normal. Vascular: Major intracranial vascular flow voids are well maintained. Skull and upper cervical spine: Craniocervical junction within normal limits. Bone marrow signal intensity normal. No scalp soft tissue abnormality. Other: No mastoid effusion.  Inner ear structures normal. MRI ORBIT FINDINGS Orbits: Globes are symmetric in size with normal morphology and appearance bilaterally. Right optic nerve is enlarged as compared to the left with associated intra neural edema and restricted diffusion. Optic discs seen bulging at the posterior aspect of the globe. Avid postcontrast enhancement seen throughout the majority of the intraorbital portion of the nerve, consistent with acute optic neuritis. Surrounding perineural inflammatory stranding. Relative sparing of the pre chiasmatic segment. Optic chiasm itself is normal in appearance. Left optic nerve within normal limits  without edema or enhancement. Remainder of the intraorbital contents including the extraocular muscles, superior orbital veins, and lacrimal glands are normal in appearance. Sinuses: Visualized paranasal sinuses are clear. Soft tissues: Periorbital soft tissues within normal limits. MRI CERVICAL SPINE FINDINGS Alignment: Straightening of the normal cervical lordosis. No listhesis. Vertebrae: Vertebral body heights maintained without evidence for acute or chronic fracture. Bone marrow signal intensity within normal limits. No discrete or worrisome osseous lesions. No abnormal marrow edema or enhancement. Cord: Signal intensity within the cervical spinal cord is normal. No cord signal abnormality to suggest demyelinating disease. No abnormal cord expansion or edema. No abnormal enhancement. Posterior Fossa, vertebral arteries, paraspinal tissues: Paraspinous and prevertebral soft tissues within normal limits. Normal intravascular flow voids present within the vertebral arteries bilaterally. Disc levels: No significant disc pathology seen within the cervical spine. No disc bulge or disc protrusion. No significant canal or foraminal stenosis. IMPRESSION: MRI BRAIN IMPRESSION 1. Approximate 5 total T2/FLAIR signal abnormality involving the supratentorial cerebral white matter, nonspecific, but perhaps most concerning for possible demyelinating disease given the right optic nerve findings. No associated diffusion abnormality or enhancement  to suggest active demyelination. Differential considerations include sequelae of complicated migraine, vasculitis, or other chronic infectious or inflammatory process. 2. Otherwise normal brain MRI. MRI ORBIT IMPRESSION 1. Right optic nerve enlargement, edema, restricted diffusion, and enhancement. Findings consistent with acute optic neuritis. 2. Otherwise normal MRI of the orbits. MRI CERVICAL SPINE IMPRESSION Normal MRI of the cervical spinal cord. No cord signal abnormality to  suggest demyelinating disease or other acute abnormality. No significant disc pathology or stenosis. Electronically Signed   By: Rise Mu M.D.   On: 08/19/2017 23:38   Mr Cervical Spine W Or Wo Contrast  Result Date: 08/19/2017 CLINICAL DATA:  Initial evaluation for acute visual loss, severe headache. Abnormal brain MRI earlier today. EXAM: MRI HEAD/ORBITS WITHOUT AND WITH CONTRAST MRI CERVICAL SPINE WITHOUT AND WITH CONTRAST TECHNIQUE: Multiplanar, multiecho pulse sequences of the brain and surrounding structures, and cervical spine, to include the craniocervical junction and cervicothoracic junction, were obtained without and with intravenous contrast. CONTRAST:  13mL MULTIHANCE GADOBENATE DIMEGLUMINE 529 MG/ML IV SOLN COMPARISON:  Previous MRI from earlier the same day. FINDINGS: MRI HEAD FINDINGS Brain: Cerebral volume within normal limits. Approximate 5 total subcentimeter T2/FLAIR hyperintense foci again noted involving the supratentorial cerebral white matter, mild in nature, but abnormal for age. Only 1 of these is periventricular in location (series 14, image 18). No associated diffusion abnormality or enhancement identified. No foci seen involving the brainstem or cerebellum. No other focal parenchymal signal abnormality. No evidence for acute infarct. Gray-white matter differentiation maintained. No foci of susceptibility artifact to suggest acute or chronic intracranial hemorrhage. No mass lesion, midline shift or mass effect. No hydrocephalus. No extra-axial fluid collection. Major dural sinuses grossly patent. No abnormal enhancement within the brain itself. Pituitary gland within normal limits. Suprasellar region normal. Midline structures intact and normal. Vascular: Major intracranial vascular flow voids are well maintained. Skull and upper cervical spine: Craniocervical junction within normal limits. Bone marrow signal intensity normal. No scalp soft tissue abnormality. Other: No  mastoid effusion.  Inner ear structures normal. MRI ORBIT FINDINGS Orbits: Globes are symmetric in size with normal morphology and appearance bilaterally. Right optic nerve is enlarged as compared to the left with associated intra neural edema and restricted diffusion. Optic discs seen bulging at the posterior aspect of the globe. Avid postcontrast enhancement seen throughout the majority of the intraorbital portion of the nerve, consistent with acute optic neuritis. Surrounding perineural inflammatory stranding. Relative sparing of the pre chiasmatic segment. Optic chiasm itself is normal in appearance. Left optic nerve within normal limits without edema or enhancement. Remainder of the intraorbital contents including the extraocular muscles, superior orbital veins, and lacrimal glands are normal in appearance. Sinuses: Visualized paranasal sinuses are clear. Soft tissues: Periorbital soft tissues within normal limits. MRI CERVICAL SPINE FINDINGS Alignment: Straightening of the normal cervical lordosis. No listhesis. Vertebrae: Vertebral body heights maintained without evidence for acute or chronic fracture. Bone marrow signal intensity within normal limits. No discrete or worrisome osseous lesions. No abnormal marrow edema or enhancement. Cord: Signal intensity within the cervical spinal cord is normal. No cord signal abnormality to suggest demyelinating disease. No abnormal cord expansion or edema. No abnormal enhancement. Posterior Fossa, vertebral arteries, paraspinal tissues: Paraspinous and prevertebral soft tissues within normal limits. Normal intravascular flow voids present within the vertebral arteries bilaterally. Disc levels: No significant disc pathology seen within the cervical spine. No disc bulge or disc protrusion. No significant canal or foraminal stenosis. IMPRESSION: MRI BRAIN IMPRESSION 1. Approximate 5 total T2/FLAIR  signal abnormality involving the supratentorial cerebral white matter,  nonspecific, but perhaps most concerning for possible demyelinating disease given the right optic nerve findings. No associated diffusion abnormality or enhancement to suggest active demyelination. Differential considerations include sequelae of complicated migraine, vasculitis, or other chronic infectious or inflammatory process. 2. Otherwise normal brain MRI. MRI ORBIT IMPRESSION 1. Right optic nerve enlargement, edema, restricted diffusion, and enhancement. Findings consistent with acute optic neuritis. 2. Otherwise normal MRI of the orbits. MRI CERVICAL SPINE IMPRESSION Normal MRI of the cervical spinal cord. No cord signal abnormality to suggest demyelinating disease or other acute abnormality. No significant disc pathology or stenosis. Electronically Signed   By: Rise Mu M.D.   On: 08/19/2017 23:38   Mr Rockwell Germany ZO Contrast  Result Date: 08/19/2017 CLINICAL DATA:  Initial evaluation for acute visual loss, severe headache. Abnormal brain MRI earlier today. EXAM: MRI HEAD/ORBITS WITHOUT AND WITH CONTRAST MRI CERVICAL SPINE WITHOUT AND WITH CONTRAST TECHNIQUE: Multiplanar, multiecho pulse sequences of the brain and surrounding structures, and cervical spine, to include the craniocervical junction and cervicothoracic junction, were obtained without and with intravenous contrast. CONTRAST:  13mL MULTIHANCE GADOBENATE DIMEGLUMINE 529 MG/ML IV SOLN COMPARISON:  Previous MRI from earlier the same day. FINDINGS: MRI HEAD FINDINGS Brain: Cerebral volume within normal limits. Approximate 5 total subcentimeter T2/FLAIR hyperintense foci again noted involving the supratentorial cerebral white matter, mild in nature, but abnormal for age. Only 1 of these is periventricular in location (series 14, image 18). No associated diffusion abnormality or enhancement identified. No foci seen involving the brainstem or cerebellum. No other focal parenchymal signal abnormality. No evidence for acute infarct. Gray-white  matter differentiation maintained. No foci of susceptibility artifact to suggest acute or chronic intracranial hemorrhage. No mass lesion, midline shift or mass effect. No hydrocephalus. No extra-axial fluid collection. Major dural sinuses grossly patent. No abnormal enhancement within the brain itself. Pituitary gland within normal limits. Suprasellar region normal. Midline structures intact and normal. Vascular: Major intracranial vascular flow voids are well maintained. Skull and upper cervical spine: Craniocervical junction within normal limits. Bone marrow signal intensity normal. No scalp soft tissue abnormality. Other: No mastoid effusion.  Inner ear structures normal. MRI ORBIT FINDINGS Orbits: Globes are symmetric in size with normal morphology and appearance bilaterally. Right optic nerve is enlarged as compared to the left with associated intra neural edema and restricted diffusion. Optic discs seen bulging at the posterior aspect of the globe. Avid postcontrast enhancement seen throughout the majority of the intraorbital portion of the nerve, consistent with acute optic neuritis. Surrounding perineural inflammatory stranding. Relative sparing of the pre chiasmatic segment. Optic chiasm itself is normal in appearance. Left optic nerve within normal limits without edema or enhancement. Remainder of the intraorbital contents including the extraocular muscles, superior orbital veins, and lacrimal glands are normal in appearance. Sinuses: Visualized paranasal sinuses are clear. Soft tissues: Periorbital soft tissues within normal limits. MRI CERVICAL SPINE FINDINGS Alignment: Straightening of the normal cervical lordosis. No listhesis. Vertebrae: Vertebral body heights maintained without evidence for acute or chronic fracture. Bone marrow signal intensity within normal limits. No discrete or worrisome osseous lesions. No abnormal marrow edema or enhancement. Cord: Signal intensity within the cervical spinal  cord is normal. No cord signal abnormality to suggest demyelinating disease. No abnormal cord expansion or edema. No abnormal enhancement. Posterior Fossa, vertebral arteries, paraspinal tissues: Paraspinous and prevertebral soft tissues within normal limits. Normal intravascular flow voids present within the vertebral arteries bilaterally. Disc levels: No significant  disc pathology seen within the cervical spine. No disc bulge or disc protrusion. No significant canal or foraminal stenosis. IMPRESSION: MRI BRAIN IMPRESSION 1. Approximate 5 total T2/FLAIR signal abnormality involving the supratentorial cerebral white matter, nonspecific, but perhaps most concerning for possible demyelinating disease given the right optic nerve findings. No associated diffusion abnormality or enhancement to suggest active demyelination. Differential considerations include sequelae of complicated migraine, vasculitis, or other chronic infectious or inflammatory process. 2. Otherwise normal brain MRI. MRI ORBIT IMPRESSION 1. Right optic nerve enlargement, edema, restricted diffusion, and enhancement. Findings consistent with acute optic neuritis. 2. Otherwise normal MRI of the orbits. MRI CERVICAL SPINE IMPRESSION Normal MRI of the cervical spinal cord. No cord signal abnormality to suggest demyelinating disease or other acute abnormality. No significant disc pathology or stenosis. Electronically Signed   By: Rise Mu M.D.   On: 08/19/2017 23:38    Labs:  CBC: Recent Labs    08/19/17 1009 08/20/17 0056 08/30/17 0848  WBC 5.1 7.5 5.0  HGB 14.2 14.4 14.4  HCT 44.3 43.8 44.2  PLT 181 181 151    COAGS: No results for input(s): INR, APTT in the last 8760 hours.  BMP: Recent Labs    08/19/17 1009 08/20/17 0056 08/26/17 0444 08/27/17 0344 08/28/17 0526  NA 138  --  136 137 135  K 4.2  --  3.8 3.9 4.1  CL 104  --  104 103 100*  CO2 24  --  27 28 28   GLUCOSE 95  --  85 86 89  BUN 8  --  14 15 11     CALCIUM 9.3  --  8.2* 8.6* 8.8*  CREATININE 0.79 0.87 0.80 0.92 0.95  GFRNONAA >60 >60 >60 >60 >60  GFRAA >60 >60 >60 >60 >60    LIVER FUNCTION TESTS: No results for input(s): BILITOT, AST, ALT, ALKPHOS, PROT, ALBUMIN in the last 8760 hours.  TUMOR MARKERS: No results for input(s): AFPTM, CEA, CA199, CHROMGRNA in the last 8760 hours.  Assessment and Plan: Optic Neuritis Patient presents with visual changes.  She has been diagnosed with optic neuritis.  She has completed IV steroids as well as IVIG without improvement.   She now plans for plasma exchanges which will be done as inpatient and outpatient.  IR consulted for tunneled plasmapheresis catheter placement. She did have breakfast this AM.  Now NPO.  Lovenox was held 5/13. Plan to proceed this afternoon as schedule allows.   Risks and benefits discussed with the patient including, but not limited to bleeding, infection, vascular injury, pneumothorax which may require chest tube placement, air embolism or even death  All of the patient's questions were answered, patient is agreeable to proceed. Consent signed and in chart.  Thank you for this interesting consult.  I greatly enjoyed meeting [employer] and look forward to participating in their care.  A copy of this report was sent to the requesting provider on this date.  Electronically Signed: Hoyt Koch, PA 08/30/2017, 9:35 AM   I spent a total of 40 Minutes    in face to face in clinical consultation, greater than 50% of which was counseling/coordinating care for optic neuritis.

## 2017-08-30 NOTE — Progress Notes (Signed)
Neurology Progress Note   S:// She is seen and examined.  No acute changes.   O:// Current vital signs: BP 105/72 (BP Location: Left Arm)   Pulse 84   Temp 98.2 F (36.8 C) (Oral)   Resp 18   Ht 5\' 1"  (1.549 m)   Wt 65.8 kg (145 lb)   LMP 08/05/2017   SpO2 96%   BMI 27.40 kg/m  Vital signs in last 24 hours: Temp:  [98.2 F (36.8 C)-99.2 F (37.3 C)] 98.2 F (36.8 C) (05/13 2336) Pulse Rate:  [73-104] 84 (05/13 2336) Resp:  [18] 18 (05/13 2336) BP: (105-117)/(72-84) 105/72 (05/13 2336) SpO2:  [96 %-99 %] 96 % (05/13 2336) Unchanged exam Gen: WD WN NAD CVS: S1S2+ HEENT: [place] AT RESP: CTABL Neurological exam: Mental Status: Alert and oriented. Speech intact. Cranial Nerves: Right relative afferent pupillary response present-pupil reactive on right, sluggish, left normal. Left eye normal vision. Right eye can see things moving with bright lights present. Unable to distinguish objects. No diplopia noted. EOMI intact, but right eye still somewhat painful with lateral eye movements. Face symmetric. Motor: 5/5 in all extremities Sensory: Intact to light touch all over Cerebellar: Normal; no ataxia noted  Medications  Current Facility-Administered Medications:  .  0.9 %  sodium chloride infusion, 250 mL, Intravenous, PRN, Wouk, Wilfred Curtis, MD .  acetaminophen (TYLENOL) tablet 650 mg, 650 mg, Oral, Q6H PRN, Arrien, York Ram, MD, 650 mg at 08/29/17 1920 .  enoxaparin (LOVENOX) injection 40 mg, 40 mg, Subcutaneous, Q24H, Wouk, Wilfred Curtis, MD, 40 mg at 08/28/17 2304 .  magnesium hydroxide (MILK OF MAGNESIA) suspension 15-30 mL, 15-30 mL, Oral, Daily PRN, Tyrone Nine, MD, 30 mL at 08/22/17 2125 .  ondansetron (ZOFRAN-ODT) disintegrating tablet 8 mg, 8 mg, Oral, Q8H PRN, Hazeline Junker B, MD .  pantoprazole (PROTONIX) EC tablet 40 mg, 40 mg, Oral, Q0600, Milon Dikes, MD, 40 mg at 08/30/17 0630 .  sodium chloride flush (NS) 0.9 % injection 3 mL, 3 mL, Intravenous,  Q12H, Wouk, Wilfred Curtis, MD, 3 mL at 08/28/17 2100 .  sodium chloride flush (NS) 0.9 % injection 3 mL, 3 mL, Intravenous, PRN, Kathrynn Running, MD, 3 mL at 08/26/17 2313 Labs CBC    Component Value Date/Time   WBC 7.5 08/20/2017 0056   RBC 5.09 08/20/2017 0056   HGB 14.4 08/20/2017 0056   HCT 43.8 08/20/2017 0056   PLT 181 08/20/2017 0056   MCV 86.1 08/20/2017 0056   MCH 28.3 08/20/2017 0056   MCHC 32.9 08/20/2017 0056   RDW 13.4 08/20/2017 0056   LYMPHSABS 1.9 08/19/2017 1009   MONOABS 0.3 08/19/2017 1009   EOSABS 0.1 08/19/2017 1009   BASOSABS 0.0 08/19/2017 1009    CMP     Component Value Date/Time   NA 135 08/28/2017 0526   K 4.1 08/28/2017 0526   CL 100 (L) 08/28/2017 0526   CO2 28 08/28/2017 0526   GLUCOSE 89 08/28/2017 0526   BUN 11 08/28/2017 0526   CREATININE 0.95 08/28/2017 0526   CALCIUM 8.8 (L) 08/28/2017 0526   AST 13 08/04/2012 1640   GFRNONAA >60 08/28/2017 0526   GFRAA >60 08/28/2017 0526  NMO antibody negative  Imaging I have reviewed images in epic and the results pertinent to this consultation are: MRI positive for right optic neuritis  Assessment:  27 year old with right optic neuritis status post IV Solu-Medrol 1000 mg x 5 days with minimal response.  Started on IVIG that she completed for  5 days yesterday again with not too much improvement in symptoms. Given young age and significant disability with visual loss, I discussed the risks and benefits of plasma exchange with her.  She has agreed to proceed with plasma exchange.  Impression: Right optic neuritis Possible multiple sclerosis  Recommendations --I have put in the orders for therapeutic plasma exchange as well as tunneled catheter to be placed by interventional radiology for the plasma exchange. --She will be getting a total of 5 rounds of plasma exchange, every other day with the first plasma exchange being in the hospital. --She can be discharged after the first plasma exchange  and come back every other day as an outpatient to hemodialysis department for the remainder of the plasma exchanges. --I have spoken with the charge nurse in hemodialysis unit, who is aware of the patient. --I have ordered CBC and PT/INR as labs have not been drawn for few days. --I will also attempt to personally communicate with interventional radiology to expedite the line placement. --On discharge, she would need a follow-up appointment with Dr. Epimenio Foot at [facility] neurology in the next 4 weeks.  I have discussed my line in detail with the patient.  I will also communicate my plan to the attending hospitalist.  Please call neurology with questions  -- Milon Dikes, MD [facility] Pager: [phone] If 7pm to 7am, please call on call as listed on AMION.

## 2017-08-30 NOTE — Progress Notes (Signed)
Patient return to unit from IR at this time. VSS. Pt denied any pain or distress. Site appears CDI. Will continue to monitor.   Sim Boast, RN

## 2017-08-30 NOTE — Care Management Note (Signed)
Case Management Note  Patient Details  Name: Jennifer [name] MRN: [id number] Date of Birth: [birth year]  Subjective/Objective:                    Action/Plan: Pt to start plasma exchange. CM following for d/c needs, physician orders.   Expected Discharge Date:  08/29/17               Expected Discharge Plan:  Home/Self Care  In-House Referral:  NA  Discharge planning Services  CM Consult  Post Acute Care Choice:  NA Choice offered to:  NA  DME Arranged:  N/A DME Agency:  NA  HH Arranged:  NA HH Agency:  NA  Status of Service:  In process, will continue to follow  If discussed at Long Length of Stay Meetings, dates discussed:    Additional Comments:  Kermit Balo, RN 08/30/2017, 11:16 AM

## 2017-08-30 NOTE — Progress Notes (Signed)
PROGRESS NOTE    Jennifer [name]  EQA:[id number] DOB: December 12, 1990 DOA: 08/19/2017 PCP: Patient, No Pcp Per    Brief Narrative:  27 year old female who presented with headache andright eyevision loss. Patient does have the significant past medical history for tobacco abuse. Patient reported 7-day history of severe right-sided headache, associated with progressive right eye visual loss. On the initial physical examination blood pressure 117/69, heart rate 88, respirate rate18, temperature 98.8, oxygen saturation 100%.Moist mucous membranes, lungs clear to auscultation bilaterally, heart S1-S2 present and rhythmic, the abdomen soft nontender, no lower extremity edema.Patient had diminished vision in the right eye. Sodium 138, potassium 4.2, chloride 104, bicarb 24, glucose 95, BUN 8, creatinine 0.79, white count 5.1, hemoglobin 14.2, hematocrit 44.3, platelets 181.Urine analysis negative for infection. Urine pregnancy test negative.Head CT was a normal study. Brain MRI with approximate 5 total left T2/flair signal abnormalities involving the supratentorial cerebral white matter,right optic nerve enlargement, edema, restricted diffusion and enhancement.Findings consistent with acute optic neuritis.Normal cervical spine MRI.  Patient was admitted to the hospital with the working diagnosis of right optic neuritis.   Assessment & Plan:   Principal Problem:   Optic neuritis, right Active Problems:   Nicotine abuse  1.Right optic neuritis.Patient has received course of high dose pulsed steroids for 5 days and IV Ig for another 5 days, with no significant improvement on her right eye vision. Case discussed with Dr. Wilford Corner, will proceed with plasma exchange. IR consulted for tunnel catheter to be place, the patient will get his first therapy in the hospital, further treatments as outpatient.   2.Tobacco abuse. Cintinue smoking cessation.  3.Constipation. It has  resolved. Patient is ambulating.   DVT prophylaxis: out of bed   Code Status:  full Family Communication: no family at the bedside  Disposition Plan: Home after completion of first plasma exchange treatment.   Consultants:   Neurology   Procedures:     Antimicrobials:      Subjective: Patient is stable vision on the right eye, no improvement over last 48 hours. No nausea or vomiting, no chest pain or dyspnea.   Objective: Vitals:   08/29/17 1215 08/29/17 1640 08/29/17 2045 08/29/17 2336  BP: 108/77 110/75 105/81 105/72  Pulse: 95 73 (!) 104 84  Resp: 18 18 18 18   Temp: 99.2 F (37.3 C)  98.2 F (36.8 C) 98.2 F (36.8 C)  TempSrc: Oral  Oral Oral  SpO2: 99% 99% 96% 96%  Weight:      Height:        Intake/Output Summary (Last 24 hours) at 08/30/2017 0949 Last data filed at 08/30/2017 0030 Gross per 24 hour  Intake 240 ml  Output -  Net 240 ml   Filed Weights   08/19/17 1840  Weight: 65.8 kg (145 lb)    Examination:   General: Not in pain or dyspnea Neurology: Awake and alert, non focal  E ENT: no pallor, no icterus, oral mucosa moist Cardiovascular: No JVD. S1-S2 present, rhythmic, no gallops, rubs, or murmurs. No lower extremity edema. Pulmonary: vesicular breath sounds bilaterally, adequate air movement, no wheezing, rhonchi or rales. Gastrointestinal. Abdomen flat, no organomegaly, non tender, no rebound or guarding Skin. No rashes Musculoskeletal: no joint deformities     Data Reviewed: I have personally reviewed following labs and imaging studies  CBC: Recent Labs  Lab 08/30/17 0848  WBC 5.0  NEUTROABS 2.8  HGB 14.4  HCT 44.2  MCV 84.7  PLT 151   Basic Metabolic Panel: Recent  Labs  Lab 08/26/17 0444 08/27/17 0344 08/28/17 0526  NA 136 137 135  K 3.8 3.9 4.1  CL 104 103 100*  CO2 27 28 28   GLUCOSE 85 86 89  BUN 14 15 11   CREATININE 0.80 0.92 0.95  CALCIUM 8.2* 8.6* 8.8*   GFR: Estimated Creatinine Clearance: 77.2  mL/min (by C-G formula based on SCr of 0.95 mg/dL). Liver Function Tests: No results for input(s): AST, ALT, ALKPHOS, BILITOT, PROT, ALBUMIN in the last 168 hours. No results for input(s): LIPASE, AMYLASE in the last 168 hours. No results for input(s): AMMONIA in the last 168 hours. Coagulation Profile: Recent Labs  Lab 08/30/17 0848  INR 0.96   Cardiac Enzymes: No results for input(s): CKTOTAL, CKMB, CKMBINDEX, TROPONINI in the last 168 hours. BNP (last 3 results) No results for input(s): PROBNP in the last 8760 hours. HbA1C: No results for input(s): HGBA1C in the last 72 hours. CBG: Recent Labs  Lab 08/25/17 0619 08/26/17 0608 08/27/17 0619 08/29/17 0743 08/30/17 0619  GLUCAP 102* 73 84 82 81   Lipid Profile: No results for input(s): CHOL, HDL, LDLCALC, TRIG, CHOLHDL, LDLDIRECT in the last 72 hours. Thyroid Function Tests: No results for input(s): TSH, T4TOTAL, FREET4, T3FREE, THYROIDAB in the last 72 hours. Anemia Panel: No results for input(s): VITAMINB12, FOLATE, FERRITIN, TIBC, IRON, RETICCTPCT in the last 72 hours.    Radiology Studies: I have reviewed all of the imaging during this hospital visit personally     Scheduled Meds: . [START ON 08/31/2017] enoxaparin (LOVENOX) injection  40 mg Subcutaneous Q24H  . pantoprazole  40 mg Oral Q0600  . sodium chloride flush  3 mL Intravenous Q12H   Continuous Infusions: . sodium chloride       LOS: 11 days        Adya Wirz Annett Gula, MD [facility] Pager [phone]

## 2017-08-30 NOTE — Procedures (Signed)
Acute optic neuritis  S/p RT IJ TUNNELED PHERESIS CATH  Tip svcra No comp Stable EBL 0 Ready for use  Full report in pacs

## 2017-08-31 LAB — GLUCOSE, CAPILLARY: Glucose-Capillary: 107 mg/dL — ABNORMAL HIGH (ref 65–99)

## 2017-08-31 MED ORDER — ACETAMINOPHEN 325 MG PO TABS
ORAL_TABLET | ORAL | Status: AC
  Filled 2017-08-31: qty 2

## 2017-08-31 MED ORDER — CALCIUM CARBONATE ANTACID 500 MG PO CHEW
CHEWABLE_TABLET | ORAL | Status: AC
  Filled 2017-08-31: qty 2

## 2017-08-31 MED ORDER — ACETAMINOPHEN 325 MG PO TABS
650.0000 mg | ORAL_TABLET | ORAL | Status: DC | PRN
  Administered 2017-08-31: 650 mg via ORAL

## 2017-08-31 MED ORDER — ACD FORMULA A 0.73-2.45-2.2 GM/100ML VI SOLN
500.0000 mL | Status: DC
  Filled 2017-08-31: qty 500

## 2017-08-31 MED ORDER — ACD FORMULA A 0.73-2.45-2.2 GM/100ML VI SOLN
Status: AC
  Filled 2017-08-31: qty 500

## 2017-08-31 MED ORDER — DIPHENHYDRAMINE HCL 25 MG PO CAPS
ORAL_CAPSULE | ORAL | Status: AC
  Filled 2017-08-31: qty 1

## 2017-08-31 MED ORDER — HEPARIN SODIUM (PORCINE) 1000 UNIT/ML IJ SOLN
1000.0000 [IU] | Freq: Once | INTRAMUSCULAR | Status: DC
  Filled 2017-08-31: qty 1

## 2017-08-31 MED ORDER — ACD FORMULA A 0.73-2.45-2.2 GM/100ML VI SOLN
Status: AC
  Administered 2017-08-31: 500 mL
  Filled 2017-08-31: qty 1000

## 2017-08-31 MED ORDER — CALCIUM GLUCONATE 10 % IV SOLN
2.0000 g | Freq: Once | INTRAVENOUS | Status: AC
  Administered 2017-08-31: 2 g via INTRAVENOUS
  Filled 2017-08-31: qty 20

## 2017-08-31 MED ORDER — DIPHENHYDRAMINE HCL 25 MG PO CAPS
25.0000 mg | ORAL_CAPSULE | Freq: Four times a day (QID) | ORAL | Status: DC | PRN
  Administered 2017-08-31: 25 mg via ORAL

## 2017-08-31 MED ORDER — SODIUM CHLORIDE 0.9 % IV SOLN
INTRAVENOUS | Status: AC
Start: 2017-08-31 — End: 2017-08-31
  Administered 2017-08-31 (×2): via INTRAVENOUS_CENTRAL
  Filled 2017-08-31 (×3): qty 200

## 2017-08-31 NOTE — Discharge Summary (Signed)
Physician Discharge Summary  Jennifer [name]:[id number] DOB: 1991/02/22 DOA: 08/19/2017  PCP: Patient, No Pcp Per  Admit date: 08/19/2017 Discharge date: 08/31/2017  Admitted From: Home Disposition: Home  Recommendations for Outpatient Follow-up:  1. Follow up with PCP in 1 week 2. Follow up with neurology in 4 weeks 3. Plasmapheresis every other day x5 doses, first dose given 5/15  4. Please obtain BMP/CBC in one week 5. Please follow up on the following pending results: None  Home Health: None Equipment/Devices: None  Discharge Condition: Stable CODE STATUS: Full code Diet recommendation: Regular diet   Brief/Interim Summary:  Admission HPI written by Tyrone Nine, MD   Chief Complaint: headache and vision loss  HPI: Jennifer [name] is a 27 y.o. female with hx smoking presenting with one week pounding right-sided headache and progressive right eye visual loss. Says pain is constant and moderate/severe. Vision loss has been gradual but now can only see things in right lower field with the right eye. No weakness or numbness. Hasn't happened before. No hx autoimmune disease.    Hospital course:  Optic neuritis Diagnosis made by MRI.  Neurology was consulted and started on IV steroids with little improvement.  IVIG was started for 5-day course without significant improvement as well.  Neurology recommended starting plasmapheresis x5 rounds every other day.  Patient received first dose in the hospital and was set up for outpatient management with the hemodialysis center at [facility].  Patient needs to follow-up with her primary care physician in addition to neurology for follow-up.  Discharge Diagnoses:  Principal Problem:   Optic neuritis, right Active Problems:   Nicotine abuse    Discharge Instructions  Discharge Instructions    Diet - low sodium heart healthy   Complete by:  As directed    Discharge instructions   Complete by:  As directed    Please discharge after dose of IV Ig.   Increase activity slowly   Complete by:  As directed    Increase activity slowly   Complete by:  As directed      Allergies as of 08/31/2017   No Known Allergies     Medication List    TAKE these medications   acetaminophen 500 MG tablet Commonly known as:  TYLENOL Take 1,000 mg by mouth every 6 (six) hours as needed for headache.   aspirin-acetaminophen-caffeine 250-250-65 MG tablet Commonly known as:  EXCEDRIN MIGRAINE Take 2 tablets by mouth every 6 (six) hours as needed for headache or migraine.   diphenhydrAMINE 25 MG tablet Commonly known as:  BENADRYL Take 25 mg by mouth daily as needed (headache).   EMERGEN-C VITAMIN C Pack Take 1 packet by mouth daily as needed (immune system boost).      Follow-up Information    [facility] Follow up.   Specialty:  Rehabilitation Why:  please call to schedule appointment Contact information: [address] [id number] [place] [address] [phone]       Benita Stabile, MD Follow up.   Specialty:  Internal Medicine Why:  appointment scheduled on 09/05/2017 at 3:30 pm Contact information: [address] [place] [facility] [address] [phone]        Sater, Pearletha Furl, MD. Schedule an appointment as soon as possible for a visit in 4 week(s).   Specialty:  Neurology Contact information: [address] [place] [address] [phone]  No Known Allergies  Consultations:  Neurology   Procedures/Studies: Ct Head Wo Contrast  Result Date: 08/19/2017 CLINICAL DATA:  Headache EXAM: CT HEAD WITHOUT CONTRAST TECHNIQUE: Contiguous axial images were obtained from the base of the skull through the vertex without intravenous contrast. COMPARISON:  None. FINDINGS: Brain: No acute intracranial abnormality. Specifically, no hemorrhage, hydrocephalus, mass lesion, acute infarction, or significant intracranial injury.  Vascular: No hyperdense vessel or unexpected calcification. Skull: No acute calvarial abnormality. Sinuses/Orbits: Visualized paranasal sinuses and mastoids clear. Orbital soft tissues unremarkable. Other: None IMPRESSION: Normal study. Electronically Signed   By: Charlett Nose M.D.   On: 08/19/2017 09:14   Mr Brain Wo Contrast  Result Date: 08/19/2017 CLINICAL DATA:  Severe headache.  RIGHT eye diminished vision. EXAM: MRI HEAD WITHOUT CONTRAST TECHNIQUE: Multiplanar, multiecho pulse sequences of the brain and surrounding structures were obtained without intravenous contrast. COMPARISON:  CT head earlier today. FINDINGS: Brain: No evidence for acute infarction, hemorrhage, mass lesion, hydrocephalus, or extra-axial fluid. Normal for age cerebral volume. Three to four small foci, subcortical and periventricular white matter signal abnormality, nonspecific, but abnormal for age. Considerations include complicated migraine, vasculitis, chronic infection, demyelinating disease, or idiopathic. No similar findings in the brainstem or cerebellum. Vascular: Flow voids are maintained throughout the carotid, basilar, and vertebral arteries. There are no areas of chronic hemorrhage. Skull and upper cervical spine: Normal marrow signal. No upper cervical disc pathology. Mild nasopharyngeal adenoidal hypertrophy, nonspecific. Sinuses/Orbits: Paranasal sinuses are clear. The RIGHT optic nerve is twice the size of the LEFT, and demonstrates T2 hyperintensity with restricted diffusion. Findings are consistent with RIGHT optic neuritis. Other: Compared with prior CT, the RIGHT optic nerve is confirmed to be enlarged. IMPRESSION: RIGHT optic nerve enlargement, T2 hyperintensity, and restricted diffusion consistent with optic neuritis. Small supratentorial white matter lesions, atypical for demyelinating disease; only one is periventricular. A call has been placed to the ordering provider. Electronically Signed   By: Elsie Stain  M.D.   On: 08/19/2017 11:54   Mr Laqueta Jean ZO Contrast  Result Date: 08/19/2017 CLINICAL DATA:  Initial evaluation for acute visual loss, severe headache. Abnormal brain MRI earlier today. EXAM: MRI HEAD/ORBITS WITHOUT AND WITH CONTRAST MRI CERVICAL SPINE WITHOUT AND WITH CONTRAST TECHNIQUE: Multiplanar, multiecho pulse sequences of the brain and surrounding structures, and cervical spine, to include the craniocervical junction and cervicothoracic junction, were obtained without and with intravenous contrast. CONTRAST:  13mL MULTIHANCE GADOBENATE DIMEGLUMINE 529 MG/ML IV SOLN COMPARISON:  Previous MRI from earlier the same day. FINDINGS: MRI HEAD FINDINGS Brain: Cerebral volume within normal limits. Approximate 5 total subcentimeter T2/FLAIR hyperintense foci again noted involving the supratentorial cerebral white matter, mild in nature, but abnormal for age. Only 1 of these is periventricular in location (series 14, image 18). No associated diffusion abnormality or enhancement identified. No foci seen involving the brainstem or cerebellum. No other focal parenchymal signal abnormality. No evidence for acute infarct. Gray-white matter differentiation maintained. No foci of susceptibility artifact to suggest acute or chronic intracranial hemorrhage. No mass lesion, midline shift or mass effect. No hydrocephalus. No extra-axial fluid collection. Major dural sinuses grossly patent. No abnormal enhancement within the brain itself. Pituitary gland within normal limits. Suprasellar region normal. Midline structures intact and normal. Vascular: Major intracranial vascular flow voids are well maintained. Skull and upper cervical spine: Craniocervical junction within normal limits. Bone marrow signal intensity normal. No scalp soft tissue abnormality. Other: No mastoid effusion.  Inner ear structures normal. MRI ORBIT FINDINGS Orbits: Globes  are symmetric in size with normal morphology and appearance bilaterally. Right  optic nerve is enlarged as compared to the left with associated intra neural edema and restricted diffusion. Optic discs seen bulging at the posterior aspect of the globe. Avid postcontrast enhancement seen throughout the majority of the intraorbital portion of the nerve, consistent with acute optic neuritis. Surrounding perineural inflammatory stranding. Relative sparing of the pre chiasmatic segment. Optic chiasm itself is normal in appearance. Left optic nerve within normal limits without edema or enhancement. Remainder of the intraorbital contents including the extraocular muscles, superior orbital veins, and lacrimal glands are normal in appearance. Sinuses: Visualized paranasal sinuses are clear. Soft tissues: Periorbital soft tissues within normal limits. MRI CERVICAL SPINE FINDINGS Alignment: Straightening of the normal cervical lordosis. No listhesis. Vertebrae: Vertebral body heights maintained without evidence for acute or chronic fracture. Bone marrow signal intensity within normal limits. No discrete or worrisome osseous lesions. No abnormal marrow edema or enhancement. Cord: Signal intensity within the cervical spinal cord is normal. No cord signal abnormality to suggest demyelinating disease. No abnormal cord expansion or edema. No abnormal enhancement. Posterior Fossa, vertebral arteries, paraspinal tissues: Paraspinous and prevertebral soft tissues within normal limits. Normal intravascular flow voids present within the vertebral arteries bilaterally. Disc levels: No significant disc pathology seen within the cervical spine. No disc bulge or disc protrusion. No significant canal or foraminal stenosis. IMPRESSION: MRI BRAIN IMPRESSION 1. Approximate 5 total T2/FLAIR signal abnormality involving the supratentorial cerebral white matter, nonspecific, but perhaps most concerning for possible demyelinating disease given the right optic nerve findings. No associated diffusion abnormality or enhancement to  suggest active demyelination. Differential considerations include sequelae of complicated migraine, vasculitis, or other chronic infectious or inflammatory process. 2. Otherwise normal brain MRI. MRI ORBIT IMPRESSION 1. Right optic nerve enlargement, edema, restricted diffusion, and enhancement. Findings consistent with acute optic neuritis. 2. Otherwise normal MRI of the orbits. MRI CERVICAL SPINE IMPRESSION Normal MRI of the cervical spinal cord. No cord signal abnormality to suggest demyelinating disease or other acute abnormality. No significant disc pathology or stenosis. Electronically Signed   By: Rise Mu M.D.   On: 08/19/2017 23:38   Mr Cervical Spine W Or Wo Contrast  Result Date: 08/19/2017 CLINICAL DATA:  Initial evaluation for acute visual loss, severe headache. Abnormal brain MRI earlier today. EXAM: MRI HEAD/ORBITS WITHOUT AND WITH CONTRAST MRI CERVICAL SPINE WITHOUT AND WITH CONTRAST TECHNIQUE: Multiplanar, multiecho pulse sequences of the brain and surrounding structures, and cervical spine, to include the craniocervical junction and cervicothoracic junction, were obtained without and with intravenous contrast. CONTRAST:  13mL MULTIHANCE GADOBENATE DIMEGLUMINE 529 MG/ML IV SOLN COMPARISON:  Previous MRI from earlier the same day. FINDINGS: MRI HEAD FINDINGS Brain: Cerebral volume within normal limits. Approximate 5 total subcentimeter T2/FLAIR hyperintense foci again noted involving the supratentorial cerebral white matter, mild in nature, but abnormal for age. Only 1 of these is periventricular in location (series 14, image 18). No associated diffusion abnormality or enhancement identified. No foci seen involving the brainstem or cerebellum. No other focal parenchymal signal abnormality. No evidence for acute infarct. Gray-white matter differentiation maintained. No foci of susceptibility artifact to suggest acute or chronic intracranial hemorrhage. No mass lesion, midline shift or  mass effect. No hydrocephalus. No extra-axial fluid collection. Major dural sinuses grossly patent. No abnormal enhancement within the brain itself. Pituitary gland within normal limits. Suprasellar region normal. Midline structures intact and normal. Vascular: Major intracranial vascular flow voids are well maintained. Skull and upper cervical  spine: Craniocervical junction within normal limits. Bone marrow signal intensity normal. No scalp soft tissue abnormality. Other: No mastoid effusion.  Inner ear structures normal. MRI ORBIT FINDINGS Orbits: Globes are symmetric in size with normal morphology and appearance bilaterally. Right optic nerve is enlarged as compared to the left with associated intra neural edema and restricted diffusion. Optic discs seen bulging at the posterior aspect of the globe. Avid postcontrast enhancement seen throughout the majority of the intraorbital portion of the nerve, consistent with acute optic neuritis. Surrounding perineural inflammatory stranding. Relative sparing of the pre chiasmatic segment. Optic chiasm itself is normal in appearance. Left optic nerve within normal limits without edema or enhancement. Remainder of the intraorbital contents including the extraocular muscles, superior orbital veins, and lacrimal glands are normal in appearance. Sinuses: Visualized paranasal sinuses are clear. Soft tissues: Periorbital soft tissues within normal limits. MRI CERVICAL SPINE FINDINGS Alignment: Straightening of the normal cervical lordosis. No listhesis. Vertebrae: Vertebral body heights maintained without evidence for acute or chronic fracture. Bone marrow signal intensity within normal limits. No discrete or worrisome osseous lesions. No abnormal marrow edema or enhancement. Cord: Signal intensity within the cervical spinal cord is normal. No cord signal abnormality to suggest demyelinating disease. No abnormal cord expansion or edema. No abnormal enhancement. Posterior Fossa,  vertebral arteries, paraspinal tissues: Paraspinous and prevertebral soft tissues within normal limits. Normal intravascular flow voids present within the vertebral arteries bilaterally. Disc levels: No significant disc pathology seen within the cervical spine. No disc bulge or disc protrusion. No significant canal or foraminal stenosis. IMPRESSION: MRI BRAIN IMPRESSION 1. Approximate 5 total T2/FLAIR signal abnormality involving the supratentorial cerebral white matter, nonspecific, but perhaps most concerning for possible demyelinating disease given the right optic nerve findings. No associated diffusion abnormality or enhancement to suggest active demyelination. Differential considerations include sequelae of complicated migraine, vasculitis, or other chronic infectious or inflammatory process. 2. Otherwise normal brain MRI. MRI ORBIT IMPRESSION 1. Right optic nerve enlargement, edema, restricted diffusion, and enhancement. Findings consistent with acute optic neuritis. 2. Otherwise normal MRI of the orbits. MRI CERVICAL SPINE IMPRESSION Normal MRI of the cervical spinal cord. No cord signal abnormality to suggest demyelinating disease or other acute abnormality. No significant disc pathology or stenosis. Electronically Signed   By: Rise Mu M.D.   On: 08/19/2017 23:38   Ir Fluoro Guide Cv Line Right  Result Date: 08/30/2017 INDICATION: ACUTE RIGHT OPTIC NEURITIS, ACCESS FOR PLASMAPHERESIS EXAM: ULTRASOUND GUIDANCE FOR VASCULAR ACCESS RIGHT INTERNAL JUGULAR TUNNELED PHERESIS CATHETER Date:  08/30/2017 08/30/2017 4:29 pm Radiologist:  Judie Petit. Ruel Favors, MD Guidance:  Ultrasound and fluoroscopic FLUOROSCOPY TIME:  Fluoroscopy Time:  24 seconds (1 mGy). MEDICATIONS: 2 g Ancef administered within 1 hour of the procedure ANESTHESIA/SEDATION: Versed 1.5 mg IV; Fentanyl 62.5 mcg IV; Moderate Sedation Time:  14 minutes The patient was continuously monitored during the procedure by the interventional radiology  nurse under my direct supervision. CONTRAST:  None. COMPLICATIONS: None immediate. PROCEDURE: Informed consent was obtained from the patient following explanation of the procedure, risks, benefits and alternatives. The patient understands, agrees and consents for the procedure. All questions were addressed. A time out was performed. Maximal barrier sterile technique utilized including caps, mask, sterile gowns, sterile gloves, large sterile drape, hand hygiene, and 2% chlorhexidine scrub. Under sterile conditions and local anesthesia, right internal jugular micropuncture venous access was performed with ultrasound. Images were obtained for documentation of the patent right internal jugular vein. A guide wire was inserted followed by a  transitional dilator. Next, a 0.035 guidewire was advanced into the IVC with a 5-French catheter. Measurements were obtained from the right venotomy site to the proximal right atrium. In the right infraclavicular chest, a subcutaneous tunnel was created under sterile conditions and local anesthesia. 1% lidocaine with epinephrine was utilized for this. The 19 cm tip to cuff palindrome catheter was tunneled subcutaneously to the venotomy site and inserted into the SVC/RA junction through a valved peel-away sheath. Position was confirmed with fluoroscopy. Images were obtained for documentation. Blood was aspirated from the catheter followed by saline and heparin flushes. The appropriate volume and strength of heparin was instilled in each lumen. Caps were applied. The catheter was secured at the tunnel site with Gelfoam and a pursestring suture. The venotomy site was closed with subcuticular Vicryl suture. Dermabond was applied to the small right neck incision. A dry sterile dressing was applied. The catheter is ready for use. No immediate complications. IMPRESSION: Ultrasound and fluoroscopically guided right internal jugular tunneled pheresis catheter (19 cm tip to cuff palindrome  catheter). Electronically Signed   By: Judie Petit.  Shick M.D.   On: 08/30/2017 16:32   Ir US Guide Vasc Access Right  Result Date: 08/30/2017 INDICATION: ACUTE RIGHT OPTIC NEURITIS, ACCESS FOR PLASMAPHERESIS EXAM: ULTRASOUND GUIDANCE FOR VASCULAR ACCESS RIGHT INTERNAL JUGULAR TUNNELED PHERESIS CATHETER Date:  08/30/2017 08/30/2017 4:29 pm Radiologist:  Judie Petit. Ruel Favors, MD Guidance:  Ultrasound and fluoroscopic FLUOROSCOPY TIME:  Fluoroscopy Time:  24 seconds (1 mGy). MEDICATIONS: 2 g Ancef administered within 1 hour of the procedure ANESTHESIA/SEDATION: Versed 1.5 mg IV; Fentanyl 62.5 mcg IV; Moderate Sedation Time:  14 minutes The patient was continuously monitored during the procedure by the interventional radiology nurse under my direct supervision. CONTRAST:  None. COMPLICATIONS: None immediate. PROCEDURE: Informed consent was obtained from the patient following explanation of the procedure, risks, benefits and alternatives. The patient understands, agrees and consents for the procedure. All questions were addressed. A time out was performed. Maximal barrier sterile technique utilized including caps, mask, sterile gowns, sterile gloves, large sterile drape, hand hygiene, and 2% chlorhexidine scrub. Under sterile conditions and local anesthesia, right internal jugular micropuncture venous access was performed with ultrasound. Images were obtained for documentation of the patent right internal jugular vein. A guide wire was inserted followed by a transitional dilator. Next, a 0.035 guidewire was advanced into the IVC with a 5-French catheter. Measurements were obtained from the right venotomy site to the proximal right atrium. In the right infraclavicular chest, a subcutaneous tunnel was created under sterile conditions and local anesthesia. 1% lidocaine with epinephrine was utilized for this. The 19 cm tip to cuff palindrome catheter was tunneled subcutaneously to the venotomy site and inserted into the SVC/RA junction  through a valved peel-away sheath. Position was confirmed with fluoroscopy. Images were obtained for documentation. Blood was aspirated from the catheter followed by saline and heparin flushes. The appropriate volume and strength of heparin was instilled in each lumen. Caps were applied. The catheter was secured at the tunnel site with Gelfoam and a pursestring suture. The venotomy site was closed with subcuticular Vicryl suture. Dermabond was applied to the small right neck incision. A dry sterile dressing was applied. The catheter is ready for use. No immediate complications. IMPRESSION: Ultrasound and fluoroscopically guided right internal jugular tunneled pheresis catheter (19 cm tip to cuff palindrome catheter). Electronically Signed   By: Judie Petit.  Shick M.D.   On: 08/30/2017 16:32   Mr Rockwell [place] WG Contrast  Result Date:  08/19/2017 CLINICAL DATA:  Initial evaluation for acute visual loss, severe headache. Abnormal brain MRI earlier today. EXAM: MRI HEAD/ORBITS WITHOUT AND WITH CONTRAST MRI CERVICAL SPINE WITHOUT AND WITH CONTRAST TECHNIQUE: Multiplanar, multiecho pulse sequences of the brain and surrounding structures, and cervical spine, to include the craniocervical junction and cervicothoracic junction, were obtained without and with intravenous contrast. CONTRAST:  13mL MULTIHANCE GADOBENATE DIMEGLUMINE 529 MG/ML IV SOLN COMPARISON:  Previous MRI from earlier the same day. FINDINGS: MRI HEAD FINDINGS Brain: Cerebral volume within normal limits. Approximate 5 total subcentimeter T2/FLAIR hyperintense foci again noted involving the supratentorial cerebral white matter, mild in nature, but abnormal for age. Only 1 of these is periventricular in location (series 14, image 18). No associated diffusion abnormality or enhancement identified. No foci seen involving the brainstem or cerebellum. No other focal parenchymal signal abnormality. No evidence for acute infarct. Gray-white matter differentiation maintained.  No foci of susceptibility artifact to suggest acute or chronic intracranial hemorrhage. No mass lesion, midline shift or mass effect. No hydrocephalus. No extra-axial fluid collection. Major dural sinuses grossly patent. No abnormal enhancement within the brain itself. Pituitary gland within normal limits. Suprasellar region normal. Midline structures intact and normal. Vascular: Major intracranial vascular flow voids are well maintained. Skull and upper cervical spine: Craniocervical junction within normal limits. Bone marrow signal intensity normal. No scalp soft tissue abnormality. Other: No mastoid effusion.  Inner ear structures normal. MRI ORBIT FINDINGS Orbits: Globes are symmetric in size with normal morphology and appearance bilaterally. Right optic nerve is enlarged as compared to the left with associated intra neural edema and restricted diffusion. Optic discs seen bulging at the posterior aspect of the globe. Avid postcontrast enhancement seen throughout the majority of the intraorbital portion of the nerve, consistent with acute optic neuritis. Surrounding perineural inflammatory stranding. Relative sparing of the pre chiasmatic segment. Optic chiasm itself is normal in appearance. Left optic nerve within normal limits without edema or enhancement. Remainder of the intraorbital contents including the extraocular muscles, superior orbital veins, and lacrimal glands are normal in appearance. Sinuses: Visualized paranasal sinuses are clear. Soft tissues: Periorbital soft tissues within normal limits. MRI CERVICAL SPINE FINDINGS Alignment: Straightening of the normal cervical lordosis. No listhesis. Vertebrae: Vertebral body heights maintained without evidence for acute or chronic fracture. Bone marrow signal intensity within normal limits. No discrete or worrisome osseous lesions. No abnormal marrow edema or enhancement. Cord: Signal intensity within the cervical spinal cord is normal. No cord signal  abnormality to suggest demyelinating disease. No abnormal cord expansion or edema. No abnormal enhancement. Posterior Fossa, vertebral arteries, paraspinal tissues: Paraspinous and prevertebral soft tissues within normal limits. Normal intravascular flow voids present within the vertebral arteries bilaterally. Disc levels: No significant disc pathology seen within the cervical spine. No disc bulge or disc protrusion. No significant canal or foraminal stenosis. IMPRESSION: MRI BRAIN IMPRESSION 1. Approximate 5 total T2/FLAIR signal abnormality involving the supratentorial cerebral white matter, nonspecific, but perhaps most concerning for possible demyelinating disease given the right optic nerve findings. No associated diffusion abnormality or enhancement to suggest active demyelination. Differential considerations include sequelae of complicated migraine, vasculitis, or other chronic infectious or inflammatory process. 2. Otherwise normal brain MRI. MRI ORBIT IMPRESSION 1. Right optic nerve enlargement, edema, restricted diffusion, and enhancement. Findings consistent with acute optic neuritis. 2. Otherwise normal MRI of the orbits. MRI CERVICAL SPINE IMPRESSION Normal MRI of the cervical spinal cord. No cord signal abnormality to suggest demyelinating disease or other acute abnormality. No significant disc  pathology or stenosis. Electronically Signed   By: Rise Mu M.D.   On: 08/19/2017 23:38      Subjective: Continued visual loss. Can see some light out of right eye.  Discharge Exam: Vitals:   08/31/17 1800 08/31/17 1815  BP: 114/70 116/78  Pulse: 98 80  Resp: 16 16  Temp: 98 F (36.7 C) 98.4 F (36.9 C)  SpO2:     Vitals:   08/31/17 1730 08/31/17 1745 08/31/17 1800 08/31/17 1815  BP: 102/69 103/66 114/70 116/78  Pulse: 76 80 98 80  Resp: 16 16 16 16   Temp: 98 F (36.7 C) 98 F (36.7 C) 98 F (36.7 C) 98.4 F (36.9 C)  TempSrc:      SpO2:      Weight:      Height:          General: Pt is alert, awake, not in acute distress Cardiovascular: RRR, S1/S2 +, no rubs, no gallops Respiratory: CTA bilaterally, no wheezing, no rhonchi Abdominal: Soft, NT, ND, bowel sounds + Extremities: no edema, no cyanosis Neuro: PERRL bilaterally    The results of significant diagnostics from this hospitalization (including imaging, microbiology, ancillary and laboratory) are listed below for reference.     Microbiology: No results found for this or any previous visit (from the past 240 hour(s)).   Labs: BNP (last 3 results) No results for input(s): BNP in the last 8760 hours. Basic Metabolic Panel: Recent Labs  Lab 08/26/17 0444 08/27/17 0344 08/28/17 0526  NA 136 137 135  K 3.8 3.9 4.1  CL 104 103 100*  CO2 27 28 28   GLUCOSE 85 86 89  BUN 14 15 11   CREATININE 0.80 0.92 0.95  CALCIUM 8.2* 8.6* 8.8*   Liver Function Tests: No results for input(s): AST, ALT, ALKPHOS, BILITOT, PROT, ALBUMIN in the last 168 hours. No results for input(s): LIPASE, AMYLASE in the last 168 hours. No results for input(s): AMMONIA in the last 168 hours. CBC: Recent Labs  Lab 08/30/17 0848  WBC 5.0  NEUTROABS 2.8  HGB 14.4  HCT 44.2  MCV 84.7  PLT 151   Cardiac Enzymes: No results for input(s): CKTOTAL, CKMB, CKMBINDEX, TROPONINI in the last 168 hours. BNP: Invalid input(s): POCBNP CBG: Recent Labs  Lab 08/26/17 0608 08/27/17 0619 08/29/17 0743 08/30/17 0619 08/31/17 0720  GLUCAP 73 84 82 81 107*   D-Dimer No results for input(s): DDIMER in the last 72 hours. Hgb A1c No results for input(s): HGBA1C in the last 72 hours. Lipid Profile No results for input(s): CHOL, HDL, LDLCALC, TRIG, CHOLHDL, LDLDIRECT in the last 72 hours. Thyroid function studies No results for input(s): TSH, T4TOTAL, T3FREE, THYROIDAB in the last 72 hours.  Invalid input(s): FREET3 Anemia work up No results for input(s): VITAMINB12, FOLATE, FERRITIN, TIBC, IRON, RETICCTPCT in the last  72 hours. Urinalysis    Component Value Date/Time   COLORURINE YELLOW 08/19/2017 0956   APPEARANCEUR CLEAR 08/19/2017 0956   LABSPEC 1.018 08/19/2017 0956   PHURINE 6.0 08/19/2017 0956   GLUCOSEU NEGATIVE 08/19/2017 0956   HGBUR NEGATIVE 08/19/2017 0956   BILIRUBINUR NEGATIVE 08/19/2017 0956   KETONESUR 20 (A) 08/19/2017 0956   PROTEINUR NEGATIVE 08/19/2017 0956   UROBILINOGEN 0.2 08/04/2012 0750   NITRITE NEGATIVE 08/19/2017 0956   LEUKOCYTESUR NEGATIVE 08/19/2017 1610    SIGNED:   Jacquelin Hawking, MD [facility] 08/31/2017, 6:58 PM

## 2017-08-31 NOTE — Progress Notes (Signed)
NEURO HOSPITALIST PROGRESS NOTE     Subjective: She was seen and examined. Tunneled pheresis catheter placed for plasma exchange. She will receive plasma exchange  Early today. Day 1/5.   Objective: Vitals:   08/30/17 2352 08/31/17 0509  BP: 102/60 108/85  Pulse: (!) 52 (!) 55  Resp: 20 16  Temp:  98 F (36.7 C)  SpO2: 97% 99%  Physical Exam  General: Awake, alert in NAD. HEENT-  Normocephalic, no lesions, without obvious abnormality.  Normal external eye and conjunctiva.  Right eye Cardiovascular- S1-S2 audible, pulses palpable throughout   Lungs-no rhonchi or wheezing noted, no excessive working breathing.  Saturations within normal limits Extremities- Warm, dry and intact Musculoskeletal-no joint tenderness, deformity or swelling Skin-warm and dry, no hyperpigmentation, vitiligo, or suspicious lesions Neuro:  Mental Status: Alert and oriented. Speech intact; no evidence of aphasia. Cranial Nerves: Right eye can see things moving in the periphery in the presence of bright lights; but is unable to distinguish objects. Right eye has afferent pupillary response and is sluggish. Left eye normal.   ptosis not present, extra-ocular motions intact bilaterally, but right eye still has pain with lateral eye movements. Face is symmetric, hearing intact. Midline tongue extension. Motor: 5/5 in all extremities Sensory: intact light touch throughout, bilaterally Cerebellar: Normal; No ataxia noted Gait: normal gait and station   Medications:  Scheduled: . enoxaparin (LOVENOX) injection  40 mg Subcutaneous Q24H  . pantoprazole  40 mg Oral Q0600  . sodium chloride flush  3 mL Intravenous Q12H   Continuous: . sodium chloride    .  ceFAZolin (ANCEF) IV     ZOX:WRUEAV chloride, acetaminophen, magnesium hydroxide, ondansetron, sodium chloride flush  Imaging: MRI was positive for right optic neuritis.  Assessment:  27 year old with right optic neuritis status  post IV Solu-Medrol 1000 mg x 5 days with minimal response.  Started on IVIG that she completed for 5 days yesterday again with not too much improvement in symptoms. Given young age and significant disability with visual loss, I discussed the risks and benefits of plasma exchange with her.  She has agreed to proceed with plasma exchange.  Impression: Right optic neuritis Possible multiple sclerosis  Recommendations --She will be getting a total of 5 rounds of plasma exchange, every other day with the first plasma exchange being in the hospital on today 08-31-17 --She can be discharged after the first plasma exchange and come back every other day as an outpatient to hemodialysis department for the remainder of the plasma exchanges. --I have spoken with the charge nurse in hemodialysis unit, who is aware of the patient. --On discharge, she would need a follow-up appointment with Dr. Epimenio Foot at [facility] neurology in the next 4 weeks. Patient has scheduled her first appointment for 09/08/17.  I have discussed the plan in detail with the patient.  I will also communicate my plan to the attending hospitalist.  Valentina Lucks, NP-C [facility] [phone]  08/31/2017, 8:29 AM  Attending Neurohospitalist Addendum Patient seen and examined Agree with the history and physical as documented above. Agree with the plan as documented, which I helped formulate.  I have made changes in the note above to reflect my plan. I have independently reviewed the chart, obtained history, review of systems and examined the patient.I have personally reviewed pertinent head/neck/spine imaging (CT/MRI). Please feel free to call with any questions. --- Beatrix Fetters  Wilford Corner, MD [facility] Pager: [phone]  If 7pm to 7am, please call on call as listed on AMION.

## 2017-08-31 NOTE — Care Management Note (Addendum)
Case Management Note  Patient Details  Name: Jennifer [name] MRN: [id number] Date of Birth: 06-Aug-1990  Subjective/Objective:                    Action/Plan: Plan is for patient to d/c home after her plasma exchange in HD today. CM spoke to the charge RN in hemodialysis about the patient coming back as outpatient for the next 4 treatments. Consulting civil engineer for HD states she will speak to the patient when she is in HD and go over the plan. Patient will have to return to admitting every other day and then go up to HD by 7 am. (she will not be able to have treatment on Sunday so will have to have treatment Friday and Sat or Friday and wait until Monday-HD to discuss this with her).  Patient in agreement with outpatient treatments and states she has transportation to the appointments and home today.  Pt has outpatient therapy arranged through [facility] outpt rehab.  Expected Discharge Date:  08/29/17               Expected Discharge Plan:  OP Rehab  In-House Referral:  NA  Discharge planning Services  CM Consult  Post Acute Care Choice:  NA Choice offered to:  NA  DME Arranged:  N/A DME Agency:  NA  HH Arranged:  NA HH Agency:  NA  Status of Service:  Completed, signed off  If discussed at Long Length of Stay Meetings, dates discussed:    Additional Comments:  Kermit Balo, RN 08/31/2017, 4:25 PM

## 2017-08-31 NOTE — Discharge Instructions (Signed)
Jennifer [name],  You were admitted with a diagnosis of optic neuritis.  This is caused your visual loss.  Your treated with IV steroids and treatment called IVIG without significant improvement.  Neurology has started you on plasmapheresis (exchanging the plasma portion of your blood with a donor's plasma) for 5 treatments every other day.  Please follow-up with the neurologist in 4 weeks and your primary care physician in the next week.  Sincerely,  Jacquelin Hawking, MD [facility]

## 2017-09-01 LAB — POCT I-STAT, CHEM 8
BUN: 7 mg/dL (ref 6–20)
Calcium, Ion: 1.21 mmol/L (ref 1.15–1.40)
Chloride: 103 mmol/L (ref 101–111)
Creatinine, Ser: 0.6 mg/dL (ref 0.44–1.00)
Glucose, Bld: 99 mg/dL (ref 65–99)
HCT: 41 % (ref 36.0–46.0)
Hemoglobin: 13.9 g/dL (ref 12.0–15.0)
Potassium: 3.8 mmol/L (ref 3.5–5.1)
Sodium: 140 mmol/L (ref 135–145)
TCO2: 23 mmol/L (ref 22–32)

## 2017-09-02 ENCOUNTER — Non-Acute Institutional Stay (HOSPITAL_COMMUNITY)
Admission: RE | Admit: 2017-09-02 | Discharge: 2017-09-02 | Disposition: A | Discharge status: Home / Self Care | Payer: BLUE CROSS/BLUE SHIELD | Source: Ambulatory Visit | Attending: Student in an Organized Health Care Education/Training Program | Admitting: Student in an Organized Health Care Education/Training Program

## 2017-09-02 DIAGNOSIS — H469 Unspecified optic neuritis: Secondary | ICD-10-CM | POA: Diagnosis present

## 2017-09-02 LAB — POCT I-STAT, CHEM 8
BUN: 8 mg/dL (ref 6–20)
Calcium, Ion: 1.15 mmol/L (ref 1.15–1.40)
Chloride: 107 mmol/L (ref 101–111)
Creatinine, Ser: 0.5 mg/dL (ref 0.44–1.00)
Glucose, Bld: 95 mg/dL (ref 65–99)
HCT: 39 % (ref 36.0–46.0)
Hemoglobin: 13.3 g/dL (ref 12.0–15.0)
Potassium: 3.7 mmol/L (ref 3.5–5.1)
Sodium: 143 mmol/L (ref 135–145)
TCO2: 22 mmol/L (ref 22–32)

## 2017-09-02 LAB — BASIC METABOLIC PANEL
Anion gap: 6 (ref 5–15)
BUN: 8 mg/dL (ref 6–20)
CO2: 24 mmol/L (ref 22–32)
Calcium: 8.5 mg/dL — ABNORMAL LOW (ref 8.9–10.3)
Chloride: 110 mmol/L (ref 101–111)
Creatinine, Ser: 0.62 mg/dL (ref 0.44–1.00)
GFR calc Af Amer: >60 mL/min (ref >60)
GFR calc non Af Amer: >60 mL/min (ref >60)
Glucose, Bld: 99 mg/dL (ref 65–99)
Potassium: 3.8 mmol/L (ref 3.5–5.1)
Sodium: 140 mmol/L (ref 135–145)

## 2017-09-02 LAB — CBC
HCT: 40.4 % (ref 36.0–46.0)
Hemoglobin: 12.9 g/dL (ref 12.0–15.0)
MCH: 27 pg (ref 26.0–34.0)
MCHC: 31.9 g/dL (ref 30.0–36.0)
MCV: 84.5 fL (ref 78.0–100.0)
Platelets: 99 10*3/uL — ABNORMAL LOW (ref 150–400)
RBC: 4.78 MIL/uL (ref 3.87–5.11)
RDW: 14.3 % (ref 11.5–15.5)
WBC: 3.9 10*3/uL — ABNORMAL LOW (ref 4.0–10.5)

## 2017-09-02 MED ORDER — CALCIUM CARBONATE ANTACID 500 MG PO CHEW
2.0000 | CHEWABLE_TABLET | ORAL | Status: DC
  Administered 2017-09-02: 400 mg via ORAL

## 2017-09-02 MED ORDER — ACD FORMULA A 0.73-2.45-2.2 GM/100ML VI SOLN
500.0000 mL | Status: DC
  Administered 2017-09-02: 500 mL via INTRAVENOUS

## 2017-09-02 MED ORDER — ACD FORMULA A 0.73-2.45-2.2 GM/100ML VI SOLN
Status: AC
  Filled 2017-09-02: qty 500

## 2017-09-02 MED ORDER — SODIUM CHLORIDE 0.9 % IV SOLN
2.0000 g | Freq: Once | INTRAVENOUS | Status: AC
  Administered 2017-09-02: 2 g via INTRAVENOUS
  Filled 2017-09-02: qty 20

## 2017-09-02 MED ORDER — ACETAMINOPHEN 325 MG PO TABS
ORAL_TABLET | ORAL | Status: AC
  Filled 2017-09-02: qty 2

## 2017-09-02 MED ORDER — ACETAMINOPHEN 325 MG PO TABS
650.0000 mg | ORAL_TABLET | ORAL | Status: DC | PRN
  Administered 2017-09-02: 650 mg via ORAL

## 2017-09-02 MED ORDER — CALCIUM CARBONATE ANTACID 500 MG PO CHEW
CHEWABLE_TABLET | ORAL | Status: AC
  Filled 2017-09-02: qty 2

## 2017-09-02 MED ORDER — SODIUM CHLORIDE 0.9 % IV SOLN
INTRAVENOUS | Status: AC
  Administered 2017-09-02 (×2): via INTRAVENOUS_CENTRAL
  Filled 2017-09-02 (×2): qty 200

## 2017-09-02 MED ORDER — HEPARIN SODIUM (PORCINE) 1000 UNIT/ML IJ SOLN: 1000.0000 [IU] | Freq: Once | INTRAMUSCULAR | Status: DC

## 2017-09-02 MED ORDER — DIPHENHYDRAMINE HCL 25 MG PO CAPS
25.0000 mg | ORAL_CAPSULE | Freq: Four times a day (QID) | ORAL | Status: DC | PRN
  Administered 2017-09-02: 25 mg via ORAL

## 2017-09-02 MED ORDER — DIPHENHYDRAMINE HCL 25 MG PO CAPS
ORAL_CAPSULE | ORAL | Status: AC
  Filled 2017-09-02: qty 1

## 2017-09-05 ENCOUNTER — Non-Acute Institutional Stay (HOSPITAL_COMMUNITY)
Admission: AD | Admit: 2017-09-05 | Discharge: 2017-09-05 | Disposition: A | Discharge status: Home / Self Care | Payer: BLUE CROSS/BLUE SHIELD | Source: Ambulatory Visit | Attending: Student in an Organized Health Care Education/Training Program | Admitting: Student in an Organized Health Care Education/Training Program

## 2017-09-05 DIAGNOSIS — H469 Unspecified optic neuritis: Secondary | ICD-10-CM | POA: Diagnosis present

## 2017-09-05 MED ORDER — HEPARIN SODIUM (PORCINE) 1000 UNIT/ML IJ SOLN: 1000.0000 [IU] | Freq: Once | INTRAMUSCULAR | Status: DC

## 2017-09-05 MED ORDER — DIPHENHYDRAMINE HCL 25 MG PO CAPS
25.0000 mg | ORAL_CAPSULE | Freq: Four times a day (QID) | ORAL | Status: DC | PRN
  Administered 2017-09-05: 25 mg via ORAL

## 2017-09-05 MED ORDER — SODIUM CHLORIDE 0.9 % IV SOLN
INTRAVENOUS | Status: AC
  Filled 2017-09-05 (×2): qty 200

## 2017-09-05 MED ORDER — ACETAMINOPHEN 325 MG PO TABS
650.0000 mg | ORAL_TABLET | ORAL | Status: DC | PRN
  Administered 2017-09-05: 650 mg via ORAL

## 2017-09-05 MED ORDER — ACETAMINOPHEN 325 MG PO TABS
ORAL_TABLET | ORAL | Status: AC
  Administered 2017-09-05: 650 mg via ORAL
  Filled 2017-09-05: qty 2

## 2017-09-05 MED ORDER — ACD FORMULA A 0.73-2.45-2.2 GM/100ML VI SOLN
Status: AC
  Filled 2017-09-05: qty 500

## 2017-09-05 MED ORDER — DIPHENHYDRAMINE HCL 25 MG PO CAPS
ORAL_CAPSULE | ORAL | Status: AC
  Administered 2017-09-05: 25 mg via ORAL
  Filled 2017-09-05: qty 1

## 2017-09-05 MED ORDER — SODIUM CHLORIDE 0.9 % IV SOLN
2.0000 g | Freq: Once | INTRAVENOUS | Status: AC
  Administered 2017-09-05: 2 g via INTRAVENOUS
  Filled 2017-09-05: qty 20

## 2017-09-05 MED ORDER — CALCIUM CARBONATE ANTACID 500 MG PO CHEW
CHEWABLE_TABLET | ORAL | Status: AC
  Administered 2017-09-05: 400 mg via ORAL
  Filled 2017-09-05: qty 2

## 2017-09-05 MED ORDER — ACD FORMULA A 0.73-2.45-2.2 GM/100ML VI SOLN: 500.0000 mL | Status: DC

## 2017-09-05 MED ORDER — CALCIUM CARBONATE ANTACID 500 MG PO CHEW
2.0000 | CHEWABLE_TABLET | ORAL | Status: DC
  Administered 2017-09-05: 400 mg via ORAL

## 2017-09-06 ENCOUNTER — Encounter (HOSPITAL_COMMUNITY): Payer: Self-pay

## 2017-09-06 ENCOUNTER — Other Ambulatory Visit: Payer: Self-pay

## 2017-09-06 ENCOUNTER — Ambulatory Visit (HOSPITAL_COMMUNITY): Payer: BLUE CROSS/BLUE SHIELD | Attending: Family Medicine

## 2017-09-06 DIAGNOSIS — H5410 Blindness, one eye, low vision other eye, unspecified eyes: Secondary | ICD-10-CM | POA: Insufficient documentation

## 2017-09-06 NOTE — Patient Instructions (Signed)
Resources for Optic Neuritis: Facebook page: The Optic Neuritis Foundation, [employer]. Youtube Channel: The Optic Neuritis [employer], [employer]. Website: [employer]  Kitchen Use contrasting colored stickers or dots to [employer], [employer], dishwasher, washer, dryer, etc.  On laptop and cell phone: Decrease brightness to reduce glare.  iPhone settings; display and brightness: You can increase text size and opt for bold text.  iPhone settings: Settings; General; Accessibility (variety of settings for vision loss)   Sunglasses:  Use sunglasses outside to reduce glare and eye strain from sun. Put on sunglasses prior to stepping outside.   Lighting:  Use good lighting throughout the home. Never enter a dark room from a well lit room. Give your eyes more time to adjust if moving from room to room with different lighting; especially when out in the community.   Patient provided with website for Social Security for disability questions and application.

## 2017-09-06 NOTE — Therapy (Signed)
[facility] [address] [place], [place], [address] Phone: [phone]   Fax:  [phone]  Occupational Therapy Evaluation  Patient Details  Name: Jennifer [name] MRN: [id number] Date of Birth: [birth year] Referring Provider: Hazeline Junker MD   Encounter Date: 09/06/2017  OT End of Session - 09/06/17 1559    Visit Number  1    Number of Visits  1    Authorization Type  [employer]     Authorization Time Period  30 visit limit. 0 used.     Authorization - Visit Number  1    Authorization - Number of Visits  30    OT Start Time  1437    OT Stop Time  1525    OT Time Calculation (min)  48 min    Activity Tolerance  Patient tolerated treatment well    Behavior During Therapy  WFL for tasks assessed/performed       Past Medical History:  Diagnosis Date  . Ectopic pregnancy    MTX 07/2012    Past Surgical History:  Procedure Laterality Date  . IR FLUORO GUIDE CV LINE RIGHT  08/30/2017  . IR US GUIDE VASC ACCESS RIGHT  08/30/2017  . NO PAST SURGERIES      There were no vitals filed for this visit.  Subjective Assessment - 09/06/17 1437    Subjective   S: Everything hasn't been as bad as I thought it would be.     Pertinent History  Patient is a 27 y/o female S/P right eye vision loss due to recent Optic Neuritis. Patient reports that she was experiencing severe headaches close to final exams and did not want to seek medical help until her exams were finished. After 3 weeks, once exams were completed, she saw Dr. Clelia Croft who assessed her vision and sent her immediately to the ED. patient was at [facility] for 5 days with steriod treatment, followed by IVG treatment, and is now receiving plaque infusions in an outpatient setting. MRI was completed on 08/19/17 which confirmed 5 lesions. MD at [facility] suspects MS. patient will see the Nuerologist tomorrow for her appointment. Dr. Jarvis Newcomer, Alycia Rossetti has referred patient to occupational therapy for evaluation and  treatment.      Special Tests  Basic Vision Assessment    Patient Stated Goals  To be able to function at home and in the community safely.    Currently in Pain?  Yes    Pain Score  4     Pain Location  Head    Pain Orientation  Right right behind right eye    Pain Descriptors / Indicators  Aching    Pain Type  Acute pain    Pain Radiating Towards  behind right eye    Pain Onset  More than a month ago    Pain Frequency  Constant    Aggravating Factors   loud noises, sometimes the sun, Intense fragrance    Pain Relieving Factors  pain medicain. patient takes 8 Aspirin and 3 Benadryl a day to keep the headache pain low.     Effect of Pain on Daily Activities  At 4/10, patient is able to function and complete daily tasks.         [facility] OT Assessment - 09/06/17 1453      Assessment   Medical Diagnosis  Optic Nueritis    Referring Provider  Hazeline Junker MD    Onset Date/Surgical Date  09/14/17  Hand Dominance  Right    Next MD Visit  -- Nuerologist appoment tomorrow    Prior Therapy  Patient received OT services when in acute care at [facility]       Precautions   Precautions  Other (comment) Decreased vision in Right eye      Restrictions   Weight Bearing Restrictions  No      Balance Screen   Has the patient fallen in the past 6 months  No      Home  Environment   Family/patient expects to be discharged to:  Private residence    Living Arrangements  Spouse/significant other 65 year old son    Type of Home  House    Home Access  Stairs    Home Layout  One level    Alternate Level Stairs - Number of Steps  4-5 steps    Additional Comments  Patient normally drives husband to work and son to school. She is not currently able to drive due to her vision. Patient typically makes 3 meals a day for family .        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      ADL   ADL comments  No difficulty with navigating environment at home or in the community noted. patient is independent with bathing, dressing, grooming, and house cleaning tasks. She has not attempted to meal prep since she's been home. She uses her iPhone and HP laptop frequently.       Mobility   Mobility Status  Independent      Written Expression   Dominant Hand  Right      Vision - History   Baseline Vision  Legally blind Wears glasses all the time.     Patient Visual Report  Central vision impairment;Peripheral vision impairment right eye    Additional Comments  Right eye: Able to see shadows on inferior, left and right side periphal, vision. No central vision or inferior visual field.       Vision Assessment   Eye Alignment  Within Functional Limits    Vision Assessment  Vision tested    Visual Acuity  Other (comment) Unable to report visual acuity from previously    Ocular Range of Motion  Within Functional Limits    Alignment/Gaze Preference  Within Defined Limits    Tracking/Visual Pursuits  Decreased smoothness of eye movement to Right superior field    Saccades  Additional eye shifts occurred during testing    Convergence  Within functional limits      Cognition   Overall Cognitive Status  Within Functional Limits for tasks assessed                      OT Education - 09/06/17 1557    Education provided  Yes    Education Details  Discussed possible return of vision and chances based on treatment. Patient educated on contrast, and use of dots/colored stickers in the kitchen on appliances, use of sunglasses, decreasing glare from cell phone and laptop screen, available settings on iPhone for vision.     Person(s) Educated  Patient    Methods  Explanation;Demonstration;Handout    Comprehension  Verbalized understanding       OT Short Term Goals - 09/06/17 1603      OT  SHORT TERM GOAL #1   Title  Patient will be  educated and provided with handouts and information regarding optic nueritis, adaptive tools and techniques to utilize in daily life to increase independence and functional peroformance.     Time  1    Period  Days    Status  Achieved    Target Date  09/06/17               Plan - 09/06/17 1600    Clinical Impression Statement  A: Patient is a 27 y/o female S/P right eye vision loss causing increased difficulty completing daily tasks such as meal prep, leisure, driving, and school tasks due to decreased vision in the right eye.     Occupational Profile and client history currently impacting functional performance  motivated to return to prior level of function, strong social support at home.     Occupational performance deficits (Please refer to evaluation for details):  ADL's;Leisure;IADL's    Rehab Potential  Excellent    OT Frequency  One time visit    OT Treatment/Interventions  Patient/family education    Plan  P: 1 time visit only. patient provided with all educational materials discussed. Follow up with ophthalmologist and Nuerologist. Follow up with OT as needed.     Clinical Decision Making  Limited treatment options, no task modification necessary    Consulted and Agree with Plan of Care  Patient       Patient will benefit from skilled therapeutic intervention in order to improve the following deficits and impairments:  Impaired vision/preception  Visit Diagnosis: Blindness, one eye, low vision other eye, unspecified eyes - Plan: Ot plan of care cert/re-cert    Problem List Patient Active Problem List   Diagnosis Date Noted  . Nicotine abuse 08/19/2017  . Optic neuritis, right 08/19/2017  . Ectopic pregnancy    Limmie Patricia, OTR/L,CBIS  [phone]  09/06/2017, 4:08 PM  [facility] [address] [place], [place], [address] Phone: [phone]   Fax:  [phone]  Name: Jennifer [name] MRN: [id number] Date of  Birth: November 16, 1990

## 2017-09-07 ENCOUNTER — Non-Acute Institutional Stay (HOSPITAL_COMMUNITY)
Admission: RE | Admit: 2017-09-07 | Discharge: 2017-09-07 | Disposition: A | Discharge status: Home / Self Care | Payer: BLUE CROSS/BLUE SHIELD | Source: Ambulatory Visit | Attending: Student in an Organized Health Care Education/Training Program | Admitting: Student in an Organized Health Care Education/Training Program

## 2017-09-07 DIAGNOSIS — H469 Unspecified optic neuritis: Secondary | ICD-10-CM | POA: Diagnosis not present

## 2017-09-07 LAB — CBC WITH DIFFERENTIAL/PLATELET
Abs Immature Granulocytes: 0 10*3/uL (ref 0.0–0.1)
Basophils Absolute: 0 10*3/uL (ref 0.0–0.1)
Basophils Relative: 1 %
Eosinophils Absolute: 0.2 10*3/uL (ref 0.0–0.7)
Eosinophils Relative: 3 %
HCT: 38.7 % (ref 36.0–46.0)
Hemoglobin: 12.5 g/dL (ref 12.0–15.0)
Immature Granulocytes: 1 %
Lymphocytes Relative: 27 %
Lymphs Abs: 1.8 10*3/uL (ref 0.7–4.0)
MCH: 27.9 pg (ref 26.0–34.0)
MCHC: 32.3 g/dL (ref 30.0–36.0)
MCV: 86.4 fL (ref 78.0–100.0)
Monocytes Absolute: 0.5 10*3/uL (ref 0.1–1.0)
Monocytes Relative: 7 %
Neutro Abs: 4.3 10*3/uL (ref 1.7–7.7)
Neutrophils Relative %: 61 %
Platelets: 120 10*3/uL — ABNORMAL LOW (ref 150–400)
RBC: 4.48 MIL/uL (ref 3.87–5.11)
RDW: 14.8 % (ref 11.5–15.5)
WBC: 6.9 10*3/uL (ref 4.0–10.5)

## 2017-09-07 LAB — COMPREHENSIVE METABOLIC PANEL
ALT: 48 U/L (ref 14–54)
AST: 29 U/L (ref 15–41)
Albumin: 3.8 g/dL (ref 3.5–5.0)
Alkaline Phosphatase: 24 U/L — ABNORMAL LOW (ref 38–126)
Anion gap: 4 — ABNORMAL LOW (ref 5–15)
BUN: 9 mg/dL (ref 6–20)
CO2: 26 mmol/L (ref 22–32)
Calcium: 8.7 mg/dL — ABNORMAL LOW (ref 8.9–10.3)
Chloride: 108 mmol/L (ref 101–111)
Creatinine, Ser: 0.67 mg/dL (ref 0.44–1.00)
GFR calc Af Amer: >60 mL/min (ref >60)
GFR calc non Af Amer: >60 mL/min (ref >60)
Glucose, Bld: 91 mg/dL (ref 65–99)
Potassium: 4.5 mmol/L (ref 3.5–5.1)
Sodium: 138 mmol/L (ref 135–145)
Total Bilirubin: 0.8 mg/dL (ref 0.3–1.2)
Total Protein: 5.6 g/dL — ABNORMAL LOW (ref 6.5–8.1)

## 2017-09-07 MED ORDER — ACD FORMULA A 0.73-2.45-2.2 GM/100ML VI SOLN: 500.0000 mL | Status: DC

## 2017-09-07 MED ORDER — CALCIUM CARBONATE ANTACID 500 MG PO CHEW
CHEWABLE_TABLET | ORAL | Status: AC
  Administered 2017-09-07: 400 mg via ORAL
  Filled 2017-09-07: qty 2

## 2017-09-07 MED ORDER — SODIUM CHLORIDE 0.9 % IV SOLN
Freq: Once | INTRAVENOUS | Status: DC
  Filled 2017-09-07: qty 200

## 2017-09-07 MED ORDER — ACETAMINOPHEN 325 MG PO TABS
ORAL_TABLET | ORAL | Status: AC
  Administered 2017-09-07: 650 mg via ORAL
  Filled 2017-09-07: qty 2

## 2017-09-07 MED ORDER — DIPHENHYDRAMINE HCL 25 MG PO CAPS
ORAL_CAPSULE | ORAL | Status: AC
  Filled 2017-09-07: qty 1

## 2017-09-07 MED ORDER — HEPARIN SODIUM (PORCINE) 1000 UNIT/ML IJ SOLN: 1000.0000 [IU] | Freq: Once | INTRAMUSCULAR | Status: DC

## 2017-09-07 MED ORDER — SODIUM CHLORIDE 0.9 % IV SOLN
Freq: Once | INTRAVENOUS | Status: AC
  Administered 2017-09-07: 09:00:00 via INTRAVENOUS_CENTRAL
  Filled 2017-09-07: qty 200

## 2017-09-07 MED ORDER — ACETAMINOPHEN 325 MG PO TABS
650.0000 mg | ORAL_TABLET | ORAL | Status: DC | PRN
  Administered 2017-09-07: 650 mg via ORAL

## 2017-09-07 MED ORDER — DIPHENHYDRAMINE HCL 25 MG PO CAPS: 25.0000 mg | ORAL_CAPSULE | Freq: Four times a day (QID) | ORAL | Status: DC | PRN

## 2017-09-07 MED ORDER — CALCIUM CARBONATE ANTACID 500 MG PO CHEW
2.0000 | CHEWABLE_TABLET | ORAL | Status: DC
  Administered 2017-09-07: 400 mg via ORAL

## 2017-09-07 MED ORDER — SODIUM CHLORIDE 0.9 % IV SOLN
2.0000 g | INTRAVENOUS | Status: AC
  Administered 2017-09-07: 2 g via INTRAVENOUS
  Filled 2017-09-07: qty 20

## 2017-09-07 NOTE — Progress Notes (Signed)
Pt toleratd tx well; no s/s of a reaction noted; pt denies pain, n/v, dizziness, paresthesia, or weakness. Pt walk out of the unit to her awaiting ride.

## 2017-09-07 NOTE — Progress Notes (Signed)
Pt discharged home; Pt denies pain, n/v, SOB, dizziness, weakness, and paresthesia. Pt ambulated off the  unit to her awaiting ride.

## 2017-09-08 ENCOUNTER — Ambulatory Visit: Payer: BLUE CROSS/BLUE SHIELD | Admitting: Neurology

## 2017-09-08 ENCOUNTER — Encounter: Payer: Self-pay | Admitting: Neurology

## 2017-09-08 ENCOUNTER — Other Ambulatory Visit: Payer: Self-pay

## 2017-09-08 ENCOUNTER — Other Ambulatory Visit: Payer: Self-pay | Admitting: *Deleted

## 2017-09-08 VITALS — BP 102/63 | HR 82 | Resp 16 | Ht 61.0 in | Wt 154.0 lb

## 2017-09-08 DIAGNOSIS — R937 Abnormal findings on diagnostic imaging of other parts of musculoskeletal system: Secondary | ICD-10-CM

## 2017-09-08 DIAGNOSIS — R9089 Other abnormal findings on diagnostic imaging of central nervous system: Secondary | ICD-10-CM

## 2017-09-08 DIAGNOSIS — H469 Unspecified optic neuritis: Secondary | ICD-10-CM | POA: Diagnosis not present

## 2017-09-08 DIAGNOSIS — G4489 Other headache syndrome: Secondary | ICD-10-CM | POA: Diagnosis not present

## 2017-09-08 MED ORDER — INDOMETHACIN 25 MG PO CAPS: 25.0000 mg | ORAL_CAPSULE | Freq: Two times a day (BID) | ORAL | 0 refills | Status: DC

## 2017-09-08 NOTE — Progress Notes (Signed)
[facility]  PATIENT: Jennifer [name] DOB: [birth year]  REFERRING DOCTOR OR PCP:  Nita Sells SOURCE: patient, notes from ED/Admission, imaging and lab reports,   _________________________________   HISTORICAL  CHIEF COMPLAINT:  Chief Complaint  Patient presents with  . Optic Neuriits    Sts. onset of right eye pain, then blurry vision, then shaded vision, then complete loss of vision right eye.  Admitted to [facility] 08/19/17, receivewd 5 days of steroids, then IVIG with no improvement in vision.  Currently having plasmapheresis--last tx. sched. for 09/09/17.  Sts. now has some peripheral vision on sides and up, nothing straight ahead, and shady looking with downward gaze. MRI brain was abnormal, MRI C-spine showed right optic neuritis. No LP done.  Pt. here for r/o MS/fim  . Family Hx. of MS    Maternal uncle with MS, per pt./fim    HISTORY OF PRESENT ILLNESS:  I had the pleasure of seeing your patient, Jennifer [name], at the [facility] for a consultation regarding her optic neuritis and possibility of MS.    A week before she noted right visual loss, she was experiencing some right eye pain.  As the vision worsened over a few days in late April the pain was more intense.    When symptoms persisted, she went to the [facility] ED on 08/19/2017.   She had MRI's showing optic neuritis and she was admitted.   Over the next couple days, despite IV Solumedrol, her vision completely blacked out.   Because she still had complete loss of vision after 5 days of IV Solu-Medrol, plasmapheresis was initiated and she will get her last treatment tomorrow.   Vision is better and the eye pain improved with the plasmapheresis.    She still has someheadache.    Currently her right vision is better and she can see in the right and upper visual field better than the bottom or left field.    She has no central vision.     Although the headache has improved, she  still is experiencing some pain behind the right eye.  I personally reviewed the MRI of the brain and spinal cord from 08/19/2017.  The MRI of the brain/orbits shows restricted diffusion in the right optic nerve and enhancement consistent with acute optic neuritis.  There are several T2/FLAIR hyperintense foci in the subcortical white matter, the largest measuring 5 mm of the left frontal lobe.  None of these appear to be acute and they do not enhance.  The spinal cord does not have any demyelinating plaques.  I also reviewed blood work.  The neuromyelitis optica antibody is negative.  She is HIV negative.  A lumbar puncture was not performed.  She denies any problems with gait.   She has no weakness.   She has had some fluctuating numbness in her fingers for a few minutes only.    She notes some urinary frequency and mild urgency.      Mood is fine and she denies anxiety or depression.   She was sleeping well until the headaches started.   ASA and Benadryl have not helped  Her uncle (maternal) has MS.     REVIEW OF SYSTEMS: Constitutional: No fevers, chills, sweats, or change in appetite Eyes: As above ear, nose and throat: No hearing loss, ear pain, nasal congestion, sore throat Cardiovascular: No chest pain, palpitations Respiratory: No shortness of breath at rest or with exertion.   No wheezes  GastrointestinaI: No nausea, vomiting, diarrhea, abdominal pain, fecal incontinence Genitourinary: No dysuria, urinary retention or frequency.  No nocturia. Musculoskeletal: No neck pain, back pain Integumentary: No rash, pruritus, skin lesions Neurological: as above Psychiatric: No depression at this time.  No anxiety Endocrine: No palpitations, diaphoresis, change in appetite, change in weigh or increased thirst Hematologic/Lymphatic: No anemia, purpura, petechiae. Allergic/Immunologic: No itchy/runny eyes, nasal congestion, recent allergic reactions, rashes  ALLERGIES: No Known  Allergies  HOME MEDICATIONS:  Current Outpatient Medications:  .  acetaminophen (TYLENOL) 500 MG tablet, Take 1,000 mg by mouth every 6 (six) hours as needed for headache., Disp: , Rfl:  .  diphenhydrAMINE (BENADRYL) 25 MG tablet, Take 25 mg by mouth daily as needed (headache)., Disp: , Rfl:  .  Multiple Vitamins-Minerals (EMERGEN-C VITAMIN C) PACK, Take 1 packet by mouth daily as needed (immune system boost)., Disp: , Rfl:  .  aspirin-acetaminophen-caffeine (EXCEDRIN MIGRAINE) 250-250-65 MG tablet, Take 2 tablets by mouth every 6 (six) hours as needed for headache or migraine., Disp: , Rfl:  .  indomethacin (INDOCIN) 25 MG capsule, Take 1 capsule (25 mg total) by mouth 2 (two) times daily with a meal., Disp: 90 capsule, Rfl: 0 .  pseudoephedrine-acetaminophen (TYLENOL SINUS) 30-500 MG TABS tablet, Take 1 tablet by mouth every 4 (four) hours as needed., Disp: , Rfl:   PAST MEDICAL HISTORY: Past Medical History:  Diagnosis Date  . Ectopic pregnancy    MTX 07/2012  . Optic neuritis 08/19/2017   Right eye    PAST SURGICAL HISTORY: Past Surgical History:  Procedure Laterality Date  . IR FLUORO GUIDE CV LINE RIGHT  08/30/2017  . IR US GUIDE VASC ACCESS RIGHT  08/30/2017  . NO PAST SURGERIES      FAMILY HISTORY: Family History  Problem Relation Age of Onset  . Cancer Maternal Grandmother   . Heart disease Maternal Grandmother   . Breast cancer Maternal Grandmother   . Cancer Maternal Grandfather   . Fibromyalgia Mother   . Other Mother        concussion  . Other Sister        pre-eclampsia  . Lupus Sister   . Kidney disease Sister   . Asthma Son   . Multiple sclerosis Maternal Uncle     SOCIAL HISTORY:  Social History   Socioeconomic History  . Marital status: Married    Spouse name: Not on file  . Number of children: Not on file  . Years of education: Not on file  . Highest education level: Not on file  Occupational History  . Not on file  Social Needs  .  Financial resource strain: Not on file  . Food insecurity:    Worry: Not on file    Inability: Not on file  . Transportation needs:    Medical: Not on file    Non-medical: Not on file  Tobacco Use  . Smoking status: Current Every Day Smoker    Packs/day: 0.50    Years: 10.00    Pack years: 5.00    Types: Cigarettes  . Smokeless tobacco: Never Used  Substance and Sexual Activity  . Alcohol use: Yes    Alcohol/week: 4.2 oz    Types: 4 Cans of beer, 3 Shots of liquor per week    Comment: twice a month  . Drug use: Yes    Types: Marijuana    Comment: once daily  . Sexual activity: Yes    Birth control/protection: None  Lifestyle  .  Physical activity:    Days per week: Not on file    Minutes per session: Not on file  . Stress: Not on file  Relationships  . Social connections:    Talks on phone: Not on file    Gets together: Not on file    Attends religious service: Not on file    Active member of club or organization: Not on file    Attends meetings of clubs or organizations: Not on file    Relationship status: Not on file  . Intimate partner violence:    Fear of current or ex partner: Not on file    Emotionally abused: Not on file    Physically abused: Not on file    Forced sexual activity: Not on file  Other Topics Concern  . Not on file  Social History Narrative  . Not on file     PHYSICAL EXAM  Vitals:   09/08/17 1326  BP: 102/63  Pulse: 82  Resp: 16  Weight: 154 lb (69.9 kg)  Height: 5\' 1"  (1.549 m)    Body mass index is 29.1 kg/m.   General: The patient is well-developed and well-nourished and in no acute distress  Eyes:  Funduscopic exam shows normal optic discs and retinal vessels.  Neck: The neck is supple, no carotid bruits are noted.  The neck is nontender.  Cardiovascular: The heart has a regular rate and rhythm with a normal S1 and S2. There were no murmurs, gallops or rubs. Lungs are clear to auscultation.  Skin: Extremities are without  significant edema.  Musculoskeletal:  Back is nontender  Neurologic Exam  Mental status: The patient is alert and oriented x 3 at the time of the examination. The patient has apparent normal recent and remote memory, with an apparently normal attention span and concentration ability.   Speech is normal.  Cranial nerves: Extraocular movements are full.  She has a 3+ afferent pupillary defect on the right.  She has normal visual fields and color vision and acuity out of the left eye.  On the right, she cannot read but can count fingers in the inferior and right aspect of the visual field and see hand waving superiorly into the left.  Facial symmetry is present. There is good facial sensation to soft touch bilaterally.Facial strength is normal.  Trapezius and sternocleidomastoid strength is normal. No dysarthria is noted.  The tongue is midline, and the patient has symmetric elevation of the soft palate. No obvious hearing deficits are noted.  Motor:  Muscle bulk is normal.   Tone is normal. Strength is  5 / 5 in all 4 extremities.   Sensory: Sensory testing is intact to pinprick, soft touch and vibration sensation in all 4 extremities.  Coordination: Cerebellar testing reveals good finger-nose-finger and heel-to-shin bilaterally.  Gait and station: Station is normal.   Gait is normal. Tandem gait is normal. Romberg is negative.   Reflexes: Deep tendon reflexes are symmetric and normal bilaterally.   Plantar responses are flexor.    DIAGNOSTIC DATA (LABS, IMAGING, TESTING) - I reviewed patient records, labs, notes, testing and imaging myself where available.  Lab Results  Component Value Date   WBC 6.9 09/07/2017   HGB 12.5 09/07/2017   HCT 38.7 09/07/2017   MCV 86.4 09/07/2017   PLT 120 (L) 09/07/2017      Component Value Date/Time   NA 138 09/07/2017 0944   K 4.5 09/07/2017 0944   CL 108 09/07/2017 0944   CO2  26 09/07/2017 0944   GLUCOSE 91 09/07/2017 0944   BUN 9 09/07/2017  0944   CREATININE 0.67 09/07/2017 0944   CALCIUM 8.7 (L) 09/07/2017 0944   PROT 5.6 (L) 09/07/2017 0944   ALBUMIN 3.8 09/07/2017 0944   AST 29 09/07/2017 0944   ALT 48 09/07/2017 0944   ALKPHOS 24 (L) 09/07/2017 0944   BILITOT 0.8 09/07/2017 0944   GFRNONAA >60 09/07/2017 0944   GFRAA >60 09/07/2017 0944       ASSESSMENT AND PLAN  Optic neuritis - Plan: Pan-ANCA  Abnormal brain MRI  Other headache syndrome   In summary,Kairie Jaskot is a 27 year old woman with right optic neuritis who continues to have severe visual loss despite 5 days of IV steroids and plasmapheresis.  She has made some improvement so hopefully more will be forthcoming.  I discussed with her the association between optic neuritis and MS.   She does not meet the criteria for MS by the McDonald criteria.  The MRI does show some foci but they are old subcortical and could be related to her history of migraine headaches rather than MS.  Therefore, we would need additional information to diagnose her with clinically definite MS.   we will set up a lumbar puncture for CSF analysis.  If she has oligoclonal bands in the CSF not present in the serum, then MS would be much more likely and I would want to start her on a disease modifying therapy.  If the CSF is not consistent with MS, then we will recheck an MRI in about 6 months to see if there has been changes consistent with a diagnosis of MS.  I will also check ANCA and anti-myelin oligodendrocytes glycoprotein antibodies as she could also have Devic's disease.  The anti-neuromyelitis optica antibodies were negative  She will return to see me in 2 to 3 months or sooner if there are new or worsening neurologic symptoms.  Thank you for asking me to see Ms. Thissen.  Please let me know if I can be of further assistance with her or other patients in the future.  Richard A. Epimenio Foot, MD, [facility] 09/08/2017, 4:43 PM Certified in Neurology, Clinical Neurophysiology, Sleep Medicine,  Pain Medicine and Neuroimaging  [facility] [address] [place], [place] [address] [phone]

## 2017-09-09 ENCOUNTER — Non-Acute Institutional Stay (HOSPITAL_COMMUNITY)
Admission: RE | Admit: 2017-09-09 | Discharge: 2017-09-09 | Disposition: A | Discharge status: Home / Self Care | Payer: BLUE CROSS/BLUE SHIELD | Source: Ambulatory Visit | Attending: Student in an Organized Health Care Education/Training Program | Admitting: Student in an Organized Health Care Education/Training Program

## 2017-09-09 DIAGNOSIS — H469 Unspecified optic neuritis: Secondary | ICD-10-CM | POA: Insufficient documentation

## 2017-09-09 LAB — POCT I-STAT, CHEM 8
BUN: 14 mg/dL (ref 6–20)
Calcium, Ion: 1.24 mmol/L (ref 1.15–1.40)
Chloride: 103 mmol/L (ref 101–111)
Creatinine, Ser: 0.6 mg/dL (ref 0.44–1.00)
Glucose, Bld: 105 mg/dL — ABNORMAL HIGH (ref 65–99)
HCT: 37 % (ref 36.0–46.0)
Hemoglobin: 12.6 g/dL (ref 12.0–15.0)
Potassium: 3.9 mmol/L (ref 3.5–5.1)
Sodium: 141 mmol/L (ref 135–145)
TCO2: 23 mmol/L (ref 22–32)

## 2017-09-09 MED ORDER — DIPHENHYDRAMINE HCL 25 MG PO CAPS: 25.0000 mg | ORAL_CAPSULE | Freq: Four times a day (QID) | ORAL | Status: DC | PRN

## 2017-09-09 MED ORDER — CALCIUM CARBONATE ANTACID 500 MG PO CHEW
CHEWABLE_TABLET | ORAL | Status: AC
  Administered 2017-09-09: 400 mg via ORAL
  Filled 2017-09-09: qty 2

## 2017-09-09 MED ORDER — SODIUM CHLORIDE 0.9 % IV SOLN
2.0000 g | Freq: Once | INTRAVENOUS | Status: AC
  Administered 2017-09-09: 2 g via INTRAVENOUS
  Filled 2017-09-09: qty 20

## 2017-09-09 MED ORDER — HEPARIN SODIUM (PORCINE) 1000 UNIT/ML IJ SOLN: 1000.0000 [IU] | Freq: Once | INTRAMUSCULAR | Status: DC

## 2017-09-09 MED ORDER — ACD FORMULA A 0.73-2.45-2.2 GM/100ML VI SOLN: 500.0000 mL | Status: DC

## 2017-09-09 MED ORDER — ACETAMINOPHEN 325 MG PO TABS: 650.0000 mg | ORAL_TABLET | ORAL | Status: DC | PRN

## 2017-09-09 MED ORDER — CALCIUM CARBONATE ANTACID 500 MG PO CHEW
2.0000 | CHEWABLE_TABLET | ORAL | Status: DC
Start: 2017-09-09 — End: 2017-09-09
  Administered 2017-09-09: 400 mg via ORAL

## 2017-09-09 MED ORDER — ALBUMIN HUMAN 25 % IV SOLN
INTRAVENOUS | Status: AC
  Administered 2017-09-09 (×2): via INTRAVENOUS_CENTRAL
  Filled 2017-09-09 (×2): qty 200

## 2017-09-09 MED ORDER — ACD FORMULA A 0.73-2.45-2.2 GM/100ML VI SOLN
Status: AC
  Filled 2017-09-09: qty 500

## 2017-09-09 NOTE — Progress Notes (Signed)
tx completed without complications; last tx today; Dr. Wilford Corner called regarding the removal of the HD cath; referred him to IR to set up a removal time of the HD cath. Pt alert and orientedx3; n/v, pain, dizziness, paresthesia, or weakness.

## 2017-09-10 LAB — PAN-ANCA
ANCA Proteinase 3: <3.5 U/mL (ref 0.0–3.5)
Atypical pANCA: <1:20 {titer}
C-ANCA: <1:20 {titer}
Myeloperoxidase Ab: <9 U/mL (ref 0.0–9.0)
P-ANCA: <1:20 {titer}

## 2017-09-13 ENCOUNTER — Telehealth: Payer: Self-pay | Admitting: *Deleted

## 2017-09-13 NOTE — Telephone Encounter (Signed)
-----   Message from Asa Lente, MD sent at 09/13/2017  9:07 AM EDT ----- The labwork looked good.   Did she get scheduled for the LP?

## 2017-09-13 NOTE — Telephone Encounter (Signed)
Spoke with [facility] and reviewed below lab results.  She verbalized understanding of same, sts. LP is sched. for 09/16/17.  I explained we will call with the results from the CSF testing a few days after the LP.  She verbalized understanding of same/fim

## 2017-09-14 NOTE — Progress Notes (Signed)
Asked by coding for diagnosis  Optic Neuritis H46.9  -- Milon Dikes, MD [facility] Pager: [phone] If 7pm to 7am, please call on call as listed on AMION.

## 2017-09-14 NOTE — Progress Notes (Signed)
Asked by coding dept to list diagnosis for TPE  Diagnosis is Optic Neuritis. (H46.9 ICD-10)  -- Milon Dikes, MD [facility] Pager: [phone] If 7pm to 7am, please call on call as listed on AMION.

## 2017-09-15 ENCOUNTER — Other Ambulatory Visit: Payer: Self-pay | Admitting: Neurology

## 2017-09-15 ENCOUNTER — Telehealth: Payer: Self-pay | Admitting: Neurology

## 2017-09-15 ENCOUNTER — Inpatient Hospital Stay
Admit: 2017-09-15 | Discharge: 2017-09-15 | Disposition: A | Discharge status: Home / Self Care | Payer: BLUE CROSS/BLUE SHIELD | Attending: Neurology | Admitting: Neurology

## 2017-09-15 ENCOUNTER — Ambulatory Visit
Admission: RE | Admit: 2017-09-15 | Discharge: 2017-09-15 | Disposition: A | Discharge status: Home / Self Care | Payer: BLUE CROSS/BLUE SHIELD | Source: Ambulatory Visit | Attending: Neurology | Admitting: Neurology

## 2017-09-15 VITALS — BP 109/58 | HR 72

## 2017-09-15 DIAGNOSIS — H469 Unspecified optic neuritis: Secondary | ICD-10-CM

## 2017-09-15 DIAGNOSIS — R9089 Other abnormal findings on diagnostic imaging of central nervous system: Secondary | ICD-10-CM

## 2017-09-15 DIAGNOSIS — R937 Abnormal findings on diagnostic imaging of other parts of musculoskeletal system: Secondary | ICD-10-CM

## 2017-09-15 NOTE — Progress Notes (Signed)
Called Dr. Bonnita Hollow nurse so she could schedule the pt to have her HD cath removed. Pt discharge from hospital with catheter and no one has been able to tell her where to go to get it removed.

## 2017-09-15 NOTE — Telephone Encounter (Signed)
Deb an Charity fundraiser at [facility] imaging called stating that the pt has reached out to several different placed, but has yet to find someone to take out her chest catheter. Deb states that if anything the pt would need a dressing change.  Please contact the pt to advise

## 2017-09-15 NOTE — Discharge Instructions (Signed)

## 2017-09-15 NOTE — Telephone Encounter (Signed)
Spoke with [facility], who sts. she can't find anyone to see her to remove cath that was used for plasmaferesis.  It was placed at [facility] will go back to the ER for removal/fim

## 2017-09-15 NOTE — Discharge Instructions (Signed)

## 2017-09-19 ENCOUNTER — Telehealth: Payer: Self-pay | Admitting: Neurology

## 2017-09-19 ENCOUNTER — Other Ambulatory Visit (HOSPITAL_COMMUNITY): Payer: Self-pay | Admitting: Neurology

## 2017-09-19 ENCOUNTER — Telehealth: Payer: Self-pay | Admitting: Student in an Organized Health Care Education/Training Program

## 2017-09-19 DIAGNOSIS — M792 Neuralgia and neuritis, unspecified: Secondary | ICD-10-CM

## 2017-09-19 NOTE — Telephone Encounter (Signed)
Faxed lab results received from [facility] and viewed by Dr. Epimenio Foot.  No new orders received/fim

## 2017-09-19 NOTE — Telephone Encounter (Signed)
Candi from [facility] has called with STAT lab results. She is asking for a call back at [phone] refrence#[id number] F

## 2017-09-19 NOTE — Telephone Encounter (Signed)
I was paged via the hospital operator when the patient called and tried to reach someone regarding her plasmapheresis catheter removal. She reports that she is been trying to reach someone to get the catheter removed. She is spoken to someone in Dr. Bonnita Hollow office but not with Dr. Epimenio Foot at this point. I told him that I will contact fluoroscopy IR and get the appointment to remove the catheter, hopefully today. I will speak with someone in neuro RADS/fluoroscopy IR and requested catheter removal.  -- Milon Dikes, MD [facility] Pager: [phone] If 7pm to 7am, please call on call as listed on AMION.

## 2017-09-20 ENCOUNTER — Ambulatory Visit (HOSPITAL_COMMUNITY)
Admission: RE | Admit: 2017-09-20 | Discharge: 2017-09-20 | Disposition: A | Discharge status: Home / Self Care | Payer: BLUE CROSS/BLUE SHIELD | Source: Ambulatory Visit | Attending: Neurology | Admitting: Neurology

## 2017-09-20 ENCOUNTER — Encounter (HOSPITAL_COMMUNITY): Payer: Self-pay | Admitting: Interventional Radiology

## 2017-09-20 DIAGNOSIS — M792 Neuralgia and neuritis, unspecified: Secondary | ICD-10-CM

## 2017-09-20 DIAGNOSIS — Z452 Encounter for adjustment and management of vascular access device: Secondary | ICD-10-CM | POA: Diagnosis present

## 2017-09-20 HISTORY — PX: IR REMOVAL TUN CV CATH W/O FL: IMG2289

## 2017-09-20 MED ORDER — LIDOCAINE HCL 1 % IJ SOLN
INTRAMUSCULAR | Status: AC
  Filled 2017-09-20: qty 20

## 2017-09-20 MED ORDER — CHLORHEXIDINE GLUCONATE 4 % EX LIQD
CUTANEOUS | Status: AC
  Filled 2017-09-20: qty 15

## 2017-09-20 NOTE — Procedures (Signed)
S/p pheresis cath removal  No comp Stable EBL 0 Full report in pacs

## 2017-09-22 ENCOUNTER — Telehealth: Payer: Self-pay | Admitting: *Deleted

## 2017-09-22 LAB — CSF CELL COUNT WITH DIFFERENTIAL
RBC Count, CSF: 1 cells/uL (ref 0–10)
WBC, CSF: 2 cells/uL (ref 0–5)

## 2017-09-22 LAB — GLUCOSE, CSF: Glucose, CSF: 57 mg/dL (ref 40–80)

## 2017-09-22 LAB — CNS IGG SYNTHESIS RATE, CSF+BLOOD
Albumin Serum: 4.3 g/dL (ref 3.5–5.2)
Albumin, CSF: 16.4 mg/dL (ref 8.0–42.0)
CNS-IgG Synthesis Rate: -1.4 mg/24 h (ref <=3.3)
IgG (Immunoglobin G), Serum: 594 mg/dL — ABNORMAL LOW (ref 694–1618)
IgG Total CSF: 1.2 mg/dL (ref 0.8–7.7)
IgG-Index: 0.53 (ref <0.66)

## 2017-09-22 LAB — PROTEIN, CSF: Total Protein, CSF: 33 mg/dL (ref 15–45)

## 2017-09-22 LAB — OLIGOCLONAL BANDS, CSF + SERM

## 2017-09-22 NOTE — Telephone Encounter (Signed)
Spoke with [facility] and reviewed below LP results.  She verbalized understanding of same.  F/U appt. given/fim

## 2017-09-22 NOTE — Telephone Encounter (Signed)
-----   Message from Asa Lente, MD sent at 09/22/2017  8:27 AM EDT ----- Please let her know the spinal fluid looked normal --- no oligoclonal bands.    This means chance that she has MS is lower but still about 25% --- we will need to recheck another MRI in 5-6 months to make sure that she is not getting new spots.

## 2017-10-24 ENCOUNTER — Telehealth: Payer: Self-pay | Admitting: Neurology

## 2017-10-24 NOTE — Telephone Encounter (Signed)
Spoke with Hospital doctor and answered Jake Samples questions.  She is going to proceed with the testing//fim

## 2017-10-24 NOTE — Telephone Encounter (Signed)
Pt states that she has tried calling [facility] to schedule her blood being drawn and she states she has not been able to reach them.  Pt is asking for a call back as to what else she may need to do

## 2017-10-25 ENCOUNTER — Encounter: Payer: Self-pay | Admitting: Neurology

## 2018-01-02 ENCOUNTER — Telehealth: Payer: Self-pay | Admitting: Neurology

## 2018-01-02 NOTE — Telephone Encounter (Signed)
Spoke with [facility] and explained anti-Mog test (done by [facility]) was Negative. She sts. h/a's are getting better. Vision is the same--it did not return to baseline. No eye pain. She was to f/u with RAS in July-August, but has not made f/u appt. until November. I have explained he would like to see her sooner, and offered appt. 01/11/18, but pt. declined, citing transportation difficulties. She will keep her pending Nov. appt/fim

## 2018-01-02 NOTE — Telephone Encounter (Signed)
Pt called stating it has been a few months since having blood work done, wonder why she hasn't heard anything after this. Please call to advise

## 2018-02-23 ENCOUNTER — Ambulatory Visit: Payer: Self-pay | Admitting: Neurology

## 2018-02-27 ENCOUNTER — Encounter: Payer: Self-pay | Admitting: Neurology

## 2019-01-10 ENCOUNTER — Encounter (HOSPITAL_COMMUNITY): Payer: Self-pay | Admitting: Emergency Medicine

## 2019-01-10 ENCOUNTER — Emergency Department (HOSPITAL_COMMUNITY)
Admission: EM | Admit: 2019-01-10 | Discharge: 2019-01-10 | Discharge status: Against Medical Advice | Payer: BLUE CROSS/BLUE SHIELD | Attending: Emergency Medicine | Admitting: Emergency Medicine

## 2019-01-10 ENCOUNTER — Other Ambulatory Visit: Payer: Self-pay

## 2019-01-10 DIAGNOSIS — R51 Headache: Secondary | ICD-10-CM | POA: Insufficient documentation

## 2019-01-10 DIAGNOSIS — Z5321 Procedure and treatment not carried out due to patient leaving prior to being seen by health care provider: Secondary | ICD-10-CM | POA: Insufficient documentation

## 2019-01-10 NOTE — ED Triage Notes (Signed)
Pt states that she is having problems with her left eye she has had a migraine for about a week. She is losing vision in that eye.

## 2019-01-11 ENCOUNTER — Emergency Department (HOSPITAL_COMMUNITY): Payer: Self-pay

## 2019-01-11 ENCOUNTER — Encounter (HOSPITAL_COMMUNITY): Payer: Self-pay | Admitting: Emergency Medicine

## 2019-01-11 ENCOUNTER — Inpatient Hospital Stay (HOSPITAL_COMMUNITY)
Admission: EM | Admit: 2019-01-11 | Discharge: 2019-01-17 | DRG: 059 | Disposition: A | Discharge status: Home / Self Care | Payer: Self-pay | Source: Ambulatory Visit | Attending: Internal Medicine | Admitting: Internal Medicine

## 2019-01-11 ENCOUNTER — Other Ambulatory Visit: Payer: Self-pay

## 2019-01-11 DIAGNOSIS — H548 Legal blindness, as defined in USA: Secondary | ICD-10-CM | POA: Diagnosis present

## 2019-01-11 DIAGNOSIS — G360 Neuromyelitis optica [Devic]: Secondary | ICD-10-CM | POA: Diagnosis present

## 2019-01-11 DIAGNOSIS — Z114 Encounter for screening for human immunodeficiency virus [HIV]: Secondary | ICD-10-CM

## 2019-01-11 DIAGNOSIS — Z597 Insufficient social insurance and welfare support: Secondary | ICD-10-CM

## 2019-01-11 DIAGNOSIS — Z720 Tobacco use: Secondary | ICD-10-CM

## 2019-01-11 DIAGNOSIS — R29810 Facial weakness: Secondary | ICD-10-CM | POA: Diagnosis present

## 2019-01-11 DIAGNOSIS — H469 Unspecified optic neuritis: Secondary | ICD-10-CM | POA: Diagnosis present

## 2019-01-11 DIAGNOSIS — R519 Headache, unspecified: Secondary | ICD-10-CM

## 2019-01-11 DIAGNOSIS — H53122 Transient visual loss, left eye: Secondary | ICD-10-CM

## 2019-01-11 DIAGNOSIS — Z598 Other problems related to housing and economic circumstances: Secondary | ICD-10-CM

## 2019-01-11 DIAGNOSIS — Z716 Tobacco abuse counseling: Secondary | ICD-10-CM

## 2019-01-11 DIAGNOSIS — F17210 Nicotine dependence, cigarettes, uncomplicated: Secondary | ICD-10-CM | POA: Diagnosis present

## 2019-01-11 DIAGNOSIS — H50111 Monocular exotropia, right eye: Secondary | ICD-10-CM | POA: Diagnosis present

## 2019-01-11 DIAGNOSIS — H53462 Homonymous bilateral field defects, left side: Secondary | ICD-10-CM | POA: Diagnosis present

## 2019-01-11 DIAGNOSIS — H53461 Homonymous bilateral field defects, right side: Secondary | ICD-10-CM | POA: Diagnosis present

## 2019-01-11 DIAGNOSIS — Z20828 Contact with and (suspected) exposure to other viral communicable diseases: Secondary | ICD-10-CM | POA: Diagnosis present

## 2019-01-11 DIAGNOSIS — Z820 Family history of epilepsy and other diseases of the nervous system: Secondary | ICD-10-CM

## 2019-01-11 DIAGNOSIS — G35 Multiple sclerosis: Principal | ICD-10-CM | POA: Diagnosis present

## 2019-01-11 LAB — I-STAT CHEM 8, ED
BUN: 9 mg/dL (ref 6–20)
Calcium, Ion: 1.09 mmol/L — ABNORMAL LOW (ref 1.15–1.40)
Chloride: 105 mmol/L (ref 98–111)
Creatinine, Ser: 0.7 mg/dL (ref 0.44–1.00)
Glucose, Bld: 88 mg/dL (ref 70–99)
HCT: 47 % — ABNORMAL HIGH (ref 36.0–46.0)
Hemoglobin: 16 g/dL — ABNORMAL HIGH (ref 12.0–15.0)
Potassium: 3.5 mmol/L (ref 3.5–5.1)
Sodium: 136 mmol/L (ref 135–145)
TCO2: 21 mmol/L — ABNORMAL LOW (ref 22–32)

## 2019-01-11 LAB — DIFFERENTIAL
Abs Immature Granulocytes: 0.01 10*3/uL (ref 0.00–0.07)
Basophils Absolute: 0 10*3/uL (ref 0.0–0.1)
Basophils Relative: 1 %
Eosinophils Absolute: 0 10*3/uL (ref 0.0–0.5)
Eosinophils Relative: 1 %
Immature Granulocytes: 0 %
Lymphocytes Relative: 46 %
Lymphs Abs: 3.4 10*3/uL (ref 0.7–4.0)
Monocytes Absolute: 0.4 10*3/uL (ref 0.1–1.0)
Monocytes Relative: 5 %
Neutro Abs: 3.5 10*3/uL (ref 1.7–7.7)
Neutrophils Relative %: 47 %

## 2019-01-11 LAB — COMPREHENSIVE METABOLIC PANEL
ALT: 18 U/L (ref 0–44)
AST: 17 U/L (ref 15–41)
Albumin: 4.1 g/dL (ref 3.5–5.0)
Alkaline Phosphatase: 43 U/L (ref 38–126)
Anion gap: 10 (ref 5–15)
BUN: 8 mg/dL (ref 6–20)
CO2: 23 mmol/L (ref 22–32)
Calcium: 9.1 mg/dL (ref 8.9–10.3)
Chloride: 102 mmol/L (ref 98–111)
Creatinine, Ser: 0.78 mg/dL (ref 0.44–1.00)
GFR calc Af Amer: >60 mL/min (ref >60)
GFR calc non Af Amer: >60 mL/min (ref >60)
Glucose, Bld: 95 mg/dL (ref 70–99)
Potassium: 3.5 mmol/L (ref 3.5–5.1)
Sodium: 135 mmol/L (ref 135–145)
Total Bilirubin: 1.3 mg/dL — ABNORMAL HIGH (ref 0.3–1.2)
Total Protein: 6.9 g/dL (ref 6.5–8.1)

## 2019-01-11 LAB — APTT: aPTT: 33 seconds (ref 24–36)

## 2019-01-11 LAB — PROTIME-INR
INR: 1 (ref 0.8–1.2)
Prothrombin Time: 13.3 seconds (ref 11.4–15.2)

## 2019-01-11 LAB — CBC
HCT: 45.8 % (ref 36.0–46.0)
Hemoglobin: 14.9 g/dL (ref 12.0–15.0)
MCH: 28.1 pg (ref 26.0–34.0)
MCHC: 32.5 g/dL (ref 30.0–36.0)
MCV: 86.4 fL (ref 80.0–100.0)
Platelets: 155 10*3/uL (ref 150–400)
RBC: 5.3 MIL/uL — ABNORMAL HIGH (ref 3.87–5.11)
RDW: 13.2 % (ref 11.5–15.5)
WBC: 7.3 10*3/uL (ref 4.0–10.5)
nRBC: 0 % (ref 0.0–0.2)

## 2019-01-11 LAB — I-STAT BETA HCG BLOOD, ED (MC, WL, AP ONLY): I-stat hCG, quantitative: <5 m[IU]/mL (ref <5)

## 2019-01-11 MED ORDER — METOCLOPRAMIDE HCL 5 MG/ML IJ SOLN
10.0000 mg | Freq: Once | INTRAMUSCULAR | Status: AC
  Administered 2019-01-11: 10 mg via INTRAVENOUS
  Filled 2019-01-11: qty 2

## 2019-01-11 MED ORDER — SODIUM CHLORIDE 0.9% FLUSH: 3.0000 mL | Freq: Once | INTRAVENOUS | Status: DC

## 2019-01-11 MED ORDER — DIPHENHYDRAMINE HCL 50 MG/ML IJ SOLN
12.5000 mg | Freq: Once | INTRAMUSCULAR | Status: AC
  Administered 2019-01-11: 12.5 mg via INTRAVENOUS
  Filled 2019-01-11: qty 1

## 2019-01-11 NOTE — H&P (Signed)
History and Physical    Jennifer [name] GYI:[id number] DOB: 04-04-1991 DOA: 01/11/2019  PCP: Patient, No Pcp Per Patient coming from: Home  Chief Complaint: Headache and blurry vision  HPI: Jennifer [name] is a 28 y.o. female with medical history significant of optic neuritis of the right eye with complete visual loss despite receiving IV steroids and plasma exchange in May 2019, with concern for multiple sclerosis and incomplete work-up due to loss of follow-up for insurance reasons presenting to the hospital for evaluation of headache and blurry vision in the left eye.  Patient was seen by her ophthalmologist Dr. Sherryll Burger today with exam concerning for optic nerve edema and sent to the ED for further evaluation.  Patient reports having severe left-sided headaches behind her eye for the past 11 days.  States 3 days ago she started having blurry vision in her left eye.  Also having photophobia and nausea with her headaches.  No vomiting or abdominal pain otherwise.  Her appetite is good.  States she last saw Dr. Epimenio Foot about a year ago but has not had continued follow-up due to lack of insurance/ financial reasons.  ED Course: Vital signs stable on arrival.  CT head negative for acute finding.  Seen by neurology.  MRI of brain and orbits ordered.  Review of Systems:  All systems reviewed and apart from history of presenting illness, are negative.  Past Medical History:  Diagnosis Date   Ectopic pregnancy    MTX 07/2012   Optic neuritis 08/19/2017   Right eye    Past Surgical History:  Procedure Laterality Date   IR FLUORO GUIDE CV LINE RIGHT  08/30/2017   IR REMOVAL TUN CV CATH W/O FL  09/20/2017   IR US GUIDE VASC ACCESS RIGHT  08/30/2017   NO PAST SURGERIES       reports that she has been smoking cigarettes. She has a 5.00 pack-year smoking history. She has never used smokeless tobacco. She reports current alcohol use of about 7.0 standard drinks of alcohol per week. She reports  current drug use. Drug: Marijuana.  No Known Allergies  Family History  Problem Relation Age of Onset   Cancer Maternal Grandmother    Heart disease Maternal Grandmother    Breast cancer Maternal Grandmother    Cancer Maternal Grandfather    Fibromyalgia Mother    Other Mother        concussion   Other Sister        pre-eclampsia   Lupus Sister    Kidney disease Sister    Asthma Son    Multiple sclerosis Maternal Uncle     Prior to Admission medications   Medication Sig Start Date End Date Taking? Authorizing Provider  acetaminophen (TYLENOL) 650 MG CR tablet Take 1,300 mg by mouth every 8 (eight) hours as needed (headaches/pain).    Yes [provider]  diphenhydrAMINE (BENADRYL) 25 MG tablet Take 25 mg by mouth daily as needed (headache).    [provider]  indomethacin (INDOCIN) 25 MG capsule Take 1 capsule (25 mg total) by mouth 2 (two) times daily with a meal. Patient not taking: Reported on 01/11/2019 09/08/17   Asa Lente, MD    Physical Exam: Vitals:   01/11/19 1945 01/11/19 2313 01/12/19 0000 01/12/19 0208  BP: 105/75 104/65  105/72  Pulse: (!) 57 63 62 61  Resp: 16  (!) 23 16  Temp:    98.5 F (36.9 C)  TempSrc:    Oral  SpO2: 98% 99% 100% 100%  Weight:      Height:        Physical Exam  Constitutional: She is oriented to person, place, and time. She appears well-developed and well-nourished. No distress.  HENT:  Head: Normocephalic.  Mouth/Throat: Oropharynx is clear and moist.  Eyes: EOM are normal. Right eye exhibits no discharge. Left eye exhibits no discharge.  Left eye: Vision is blurry but no loss of peripheral vision  Neck: Neck supple.  Cardiovascular: Normal rate, regular rhythm and intact distal pulses.  Pulmonary/Chest: Effort normal and breath sounds normal. No respiratory distress. She has no wheezes. She has no rales.  Abdominal: Soft. Bowel sounds are normal. She exhibits no distension. There is no  abdominal tenderness. There is no guarding.  Musculoskeletal:        General: No edema.  Neurological: She is alert and oriented to person, place, and time.  Strength 5 out of 5 in bilateral upper and lower extremities.  Skin: Skin is warm and dry. She is not diaphoretic.     Labs on Admission: I have personally reviewed following labs and imaging studies  CBC: Recent Labs  Lab 01/11/19 1743 01/11/19 1744  WBC 7.3  --   NEUTROABS 3.5  --   HGB 14.9 16.0*  HCT 45.8 47.0*  MCV 86.4  --   PLT 155  --    Basic Metabolic Panel: Recent Labs  Lab 01/11/19 1743 01/11/19 1744  NA 135 136  K 3.5 3.5  CL 102 105  CO2 23  --   GLUCOSE 95 88  BUN 8 9  CREATININE 0.78 0.70  CALCIUM 9.1  --    GFR: Estimated Creatinine Clearance: 87.9 mL/min (by C-G formula based on SCr of 0.7 mg/dL). Liver Function Tests: Recent Labs  Lab 01/11/19 1743  AST 17  ALT 18  ALKPHOS 43  BILITOT 1.3*  PROT 6.9  ALBUMIN 4.1   No results for input(s): LIPASE, AMYLASE in the last 168 hours. No results for input(s): AMMONIA in the last 168 hours. Coagulation Profile: Recent Labs  Lab 01/11/19 1743  INR 1.0   Cardiac Enzymes: No results for input(s): CKTOTAL, CKMB, CKMBINDEX, TROPONINI in the last 168 hours. BNP (last 3 results) No results for input(s): PROBNP in the last 8760 hours. HbA1C: No results for input(s): HGBA1C in the last 72 hours. CBG: No results for input(s): GLUCAP in the last 168 hours. Lipid Profile: No results for input(s): CHOL, HDL, LDLCALC, TRIG, CHOLHDL, LDLDIRECT in the last 72 hours. Thyroid Function Tests: No results for input(s): TSH, T4TOTAL, FREET4, T3FREE, THYROIDAB in the last 72 hours. Anemia Panel: No results for input(s): VITAMINB12, FOLATE, FERRITIN, TIBC, IRON, RETICCTPCT in the last 72 hours. Urine analysis:    Component Value Date/Time   COLORURINE YELLOW 08/19/2017 0956   APPEARANCEUR CLEAR 08/19/2017 0956   LABSPEC 1.018 08/19/2017 0956    PHURINE 6.0 08/19/2017 0956   GLUCOSEU NEGATIVE 08/19/2017 0956   HGBUR NEGATIVE 08/19/2017 0956   BILIRUBINUR NEGATIVE 08/19/2017 0956   KETONESUR 20 (A) 08/19/2017 0956   PROTEINUR NEGATIVE 08/19/2017 0956   UROBILINOGEN 0.2 08/04/2012 0750   NITRITE NEGATIVE 08/19/2017 0956   LEUKOCYTESUR NEGATIVE 08/19/2017 0956    Radiological Exams on Admission: Dg Chest 2 View  Result Date: 01/11/2019 CLINICAL DATA:  28 year old female for admission. EXAM: CHEST - 2 VIEW COMPARISON:  None. FINDINGS: The lungs are clear. There is no pleural effusion pneumothorax. The cardiac silhouette is within normal limits. No acute  osseous pathology. IMPRESSION: No active cardiopulmonary disease. Electronically Signed   By: Arash  Radparvar M.D.   Elgie Collardn: 01/11/2019 22:23   Ct Head Wo Contrast  Result Date: 01/11/2019 CLINICAL DATA:  Patient states she has had a headache for a week and a half. Patient lost right vision in her right eye from an "MS attack with optic neuritis". Pt states the same is happening again. She is having blurry vision in the left. Pt went to her eye MD and was told to come here for IV IGE. EXAM: CT HEAD WITHOUT CONTRAST TECHNIQUE: Contiguous axial images were obtained from the base of the skull through the vertex without intravenous contrast. COMPARISON:  08/19/2017 FINDINGS: Brain: No evidence of acute infarction, hemorrhage, hydrocephalus, extra-axial collection or mass lesion/mass effect. Vascular: No hyperdense vessel or unexpected calcification. Skull: Normal. Negative for fracture or focal lesion. Sinuses/Orbits: Globes and orbits are unremarkable. The visualized sinuses and mastoid air cells are clear. Other: None. IMPRESSION: Normal unenhanced CT scan of the brain. Electronically Signed   By: Amie Portlandavid  Ormond M.D.   On: 01/11/2019 19:06   Mr Laqueta JeanBrain W And Wo Contrast  Result Date: 01/12/2019 CLINICAL DATA:  Initial evaluation for possible left optic neuritis. EXAM: MRI HEAD AND ORBITS WITHOUT  AND WITH CONTRAST TECHNIQUE: Multiplanar, multiecho pulse sequences of the brain and surrounding structures were obtained without and with intravenous contrast. Multiplanar, multiecho pulse sequences of the orbits and surrounding structures were obtained including fat saturation techniques, before and after intravenous contrast administration. CONTRAST:  6mL GADAVIST GADOBUTROL 1 MMOL/ML IV SOLN COMPARISON:  Previous MRI from 08/20/2017. FINDINGS: MRI HEAD FINDINGS Brain: Cerebral volume stable, and remains within normal limits for age. Again seen are a few scattered T2/FLAIR hyperintensities involving the periventricular and subcortical white matter of both cerebral hemispheres, nonspecific, but most suspicious for demyelinating disease/multiple sclerosis. Overall, appearance is minimally progressed relative to 2019 as evidence by AP probable new lesion involving the left splenium, faintly visible on axial FLAIR sequence (series 9, image 14). Associated mild diffusion abnormality suspicious for active demyelination (series 5, image 74), although no definite enhancement seen following contrast administration. Otherwise, the few additional scattered lesions are relatively unchanged. No other evidence for active demyelination. No brainstem or infratentorial involvement. No evidence for acute infarct. Gray-white matter differentiation without evidence for chronic or interval cortical infarction. No evidence for acute or chronic intracranial hemorrhage. No mass lesion, midline shift or mass effect. No hydrocephalus. No extra-axial fluid collection. Pituitary gland within normal limits. Midline structures intact. Vascular: Major intracranial vascular flow voids are well maintained and normal in appearance. Skull and upper cervical spine: Craniocervical junction within normal limits. Bone marrow signal intensity normal. No scalp soft tissue abnormality. Other: Mastoid air cells are clear. MRI ORBITS FINDINGS Orbits:  Globes are symmetric in size with normal appearance and morphology bilaterally. Abnormal edema with irregularity and enhancement seen involving the left optic nerve, consistent with acute optic neuritis (series 25, image 19). Hazy edema and enhancement seen within the adjacent intraconal fat. Involvement extends to the pre chiasmatic segment. Optic chiasm itself is relatively spared and normal in appearance. Right optic nerve relatively normal in appearance without evidence for acute optic neuritis. Remainder of the orbital structures including the extraocular muscles, lacrimal glands, and superior orbital veins are normal. Visualized sinuses: Visualized paranasal sinuses are clear. Soft tissues: Periorbital soft tissues within normal limits. Delete that IMPRESSION: 1. Findings consistent with acute left optic neuritis. 2. Scattered patchy T2/FLAIR hyperintensities involving the supratentorial cerebral white  matter, highly suspicious for possible demyelinating disease. Overall, appearance is minimally progressed from previous, with a subcentimeter new lesion involving the left splenium. Associated mild diffusion abnormality suspicious for active demyelination. Electronically Signed   By: Rise Mu M.D.   On: 01/12/2019 02:19   Mr Rockwell Germany MD Contrast  Result Date: 01/12/2019 CLINICAL DATA:  Initial evaluation for possible left optic neuritis. EXAM: MRI HEAD AND ORBITS WITHOUT AND WITH CONTRAST TECHNIQUE: Multiplanar, multiecho pulse sequences of the brain and surrounding structures were obtained without and with intravenous contrast. Multiplanar, multiecho pulse sequences of the orbits and surrounding structures were obtained including fat saturation techniques, before and after intravenous contrast administration. CONTRAST:  41mL GADAVIST GADOBUTROL 1 MMOL/ML IV SOLN COMPARISON:  Previous MRI from 08/20/2017. FINDINGS: MRI HEAD FINDINGS Brain: Cerebral volume stable, and remains within normal limits  for age. Again seen are a few scattered T2/FLAIR hyperintensities involving the periventricular and subcortical white matter of both cerebral hemispheres, nonspecific, but most suspicious for demyelinating disease/multiple sclerosis. Overall, appearance is minimally progressed relative to 2019 as evidence by AP probable new lesion involving the left splenium, faintly visible on axial FLAIR sequence (series 9, image 14). Associated mild diffusion abnormality suspicious for active demyelination (series 5, image 74), although no definite enhancement seen following contrast administration. Otherwise, the few additional scattered lesions are relatively unchanged. No other evidence for active demyelination. No brainstem or infratentorial involvement. No evidence for acute infarct. Gray-white matter differentiation without evidence for chronic or interval cortical infarction. No evidence for acute or chronic intracranial hemorrhage. No mass lesion, midline shift or mass effect. No hydrocephalus. No extra-axial fluid collection. Pituitary gland within normal limits. Midline structures intact. Vascular: Major intracranial vascular flow voids are well maintained and normal in appearance. Skull and upper cervical spine: Craniocervical junction within normal limits. Bone marrow signal intensity normal. No scalp soft tissue abnormality. Other: Mastoid air cells are clear. MRI ORBITS FINDINGS Orbits: Globes are symmetric in size with normal appearance and morphology bilaterally. Abnormal edema with irregularity and enhancement seen involving the left optic nerve, consistent with acute optic neuritis (series 25, image 19). Hazy edema and enhancement seen within the adjacent intraconal fat. Involvement extends to the pre chiasmatic segment. Optic chiasm itself is relatively spared and normal in appearance. Right optic nerve relatively normal in appearance without evidence for acute optic neuritis. Remainder of the orbital  structures including the extraocular muscles, lacrimal glands, and superior orbital veins are normal. Visualized sinuses: Visualized paranasal sinuses are clear. Soft tissues: Periorbital soft tissues within normal limits. Delete that IMPRESSION: 1. Findings consistent with acute left optic neuritis. 2. Scattered patchy T2/FLAIR hyperintensities involving the supratentorial cerebral white matter, highly suspicious for possible demyelinating disease. Overall, appearance is minimally progressed from previous, with a subcentimeter new lesion involving the left splenium. Associated mild diffusion abnormality suspicious for active demyelination. Electronically Signed   By: Rise Mu M.D.   On: 01/12/2019 02:19    Assessment/Plan Principal Problem:   Optic neuritis, left Active Problems:   Encounter for screening for HIV   Tobacco abuse   Left optic neuritis, concern for MS Patient is presenting with complaints of headaches and 3-day history of blurry vision in her left eye.  Patient previously had spinal tap done in May 2019 which showed no CSF oligoclonal bands. MRI brain and orbits done today with findings consistent with acute left optic neuritis.  Scattered patchy T2/FLAIR hyper intensities involving the supratentorial cerebral white matter, highly suspicious for possible demyelinating disease.  Overall, appearance is minimally progressed from previous, with a subcentimeter new lesion involving the left splenium.  Associated mild diffusion abnormality suspicious for active demyelination.   -Discussed MRI findings with Dr. Rory Percy.  She will be started on an IV steroid. -Headache cocktail (Benadryl, Compazine, Toradol PRN) -Neurology will touch base with Dr. Felecia Shelling in the morning to decide further treatment plan.  HIV screening The patient falls between the ages of 13-64 and should be screened for HIV, therefore HIV testing ordered.  Tobacco use -Counseling -NicoDerm patch  DVT  prophylaxis: Lovenox Code Status: Full code Family Communication: No family available. Disposition Plan: Anticipate discharge after clinical improvement. Consults called: Neurology Admission status: It is my clinical opinion that admission to INPATIENT is reasonable and necessary in this 28 y.o. female  presenting with left eye blurry vision secondary to optic neuritis, concern for MS.  Neurology following.  Will need IV steroid for several days.  Remainder of treatment plan based on neurology recommendations in the morning.  Given the aforementioned, the predictability of an adverse outcome is felt to be significant. I expect that the patient will require at least 2 midnights in the hospital to treat this condition.   The medical decision making on this patient was of high complexity and the patient is at high risk for clinical deterioration, therefore this is a level 3 visit.  Shela Leff MD [facility] Pager [phone]  If 7PM-7AM, please contact night-coverage [email or web address] Password [email or web address]  01/12/2019, 2:51 AM

## 2019-01-11 NOTE — ED Triage Notes (Signed)
Pt states she has had a headache for a week and a half. Pt lost right vision in her right eye from an "MS attack with optic neuritis". Pt states the same is happening again. She is having blurry vision in the left. Pt went to her eye MD and was told to come here for IV IGE.

## 2019-01-11 NOTE — ED Provider Notes (Signed)
[facility]PART[facility] Provider Note   CSN: [id number] Arrival date & time: 01/11/19  1717     History   Chief Complaint Chief Complaint  Patient presents with  . Headache  . Blurred Vision    HPI Jennifer [name] is a 28 y.o. female w with past medical history of optic neuritis of the right eye with chronic right eye blindness, presenting to the emergency department with 3 days of progressively worsening blurry vision in the left eye.  She states a week ago she began having headache, few days following was blurry vision.  She describes her blurry vision as a curtain coming down through the center of her vision. She is also having associated photophobia and pain with movement of her left eye.  She states this is how her vision loss began in her right eye last year.  She was evaluated in May 2019 and admitted with extensive neurology work-up without definite diagnosis.  She was given IVIG and plasmapheresis without improvement and now has right eye blindness.  She was seen by her ophthalmologist Dr. Brigitte Pulse today for her left eye blurriness.  Per documentation at bedside by Dr. [name], patient's left optic nerve appears inflamed with concern for optic neuritis of the left eye.     The history is provided by the patient and medical records.    Past Medical History:  Diagnosis Date  . Ectopic pregnancy    MTX 07/2012  . Optic neuritis 08/19/2017   Right eye    Patient Active Problem List   Diagnosis Date Noted  . Optic neuritis, left 01/11/2019  . Abnormal brain MRI 09/08/2017  . Other headache syndrome 09/08/2017  . Nicotine abuse 08/19/2017  . Optic neuritis, right 08/19/2017  . Ectopic pregnancy     Past Surgical History:  Procedure Laterality Date  . IR FLUORO GUIDE CV LINE RIGHT  08/30/2017  . IR REMOVAL TUN CV CATH W/O FL  09/20/2017  . IR US GUIDE VASC ACCESS RIGHT  08/30/2017  . NO PAST SURGERIES       OB History    Gravida  2   Para  1   Term  1   Preterm      AB  1   Living  1     SAB      TAB      Ectopic  1   Multiple      Live Births  1            Home Medications    Prior to Admission medications   Medication Sig Start Date End Date Taking? Authorizing Provider  acetaminophen (TYLENOL) 650 MG CR tablet Take 1,300 mg by mouth every 8 (eight) hours as needed (headaches/pain).    Yes [provider]  diphenhydrAMINE (BENADRYL) 25 MG tablet Take 25 mg by mouth daily as needed (headache).    [provider]  indomethacin (INDOCIN) 25 MG capsule Take 1 capsule (25 mg total) by mouth 2 (two) times daily with a meal. Patient not taking: Reported on 01/11/2019 09/08/17   Britt Bottom, MD    Family History Family History  Problem Relation Age of Onset  . Cancer Maternal Grandmother   . Heart disease Maternal Grandmother   . Breast cancer Maternal Grandmother   . Cancer Maternal Grandfather   . Fibromyalgia Mother   . Other Mother        concussion  . Other Sister  pre-eclampsia  . Lupus Sister   . Kidney disease Sister   . Asthma Son   . Multiple sclerosis Maternal Uncle     Social History Social History   Tobacco Use  . Smoking status: Current Every Day Smoker    Packs/day: 0.50    Years: 10.00    Pack years: 5.00    Types: Cigarettes  . Smokeless tobacco: Never Used  Substance Use Topics  . Alcohol use: Yes    Alcohol/week: 7.0 standard drinks    Types: 4 Cans of beer, 3 Shots of liquor per week    Comment: twice a month  . Drug use: Yes    Types: Marijuana    Comment: once daily     Allergies   Patient has no known allergies.   Review of Systems Review of Systems  Eyes: Positive for photophobia, pain and visual disturbance.  Neurological: Positive for headaches. Negative for facial asymmetry, speech difficulty, weakness and numbness.  All other systems reviewed and are negative.    Physical Exam Updated Vital Signs BP 104/65 (BP Location:  Right Arm)   Pulse 63   Temp 99 F (37.2 C) (Oral)   Resp 16   Ht 5\' 1"  (1.549 m)   Wt 61.2 kg   LMP 12/20/2018   SpO2 99%   BMI 25.51 kg/m   Physical Exam Vitals signs and nursing note reviewed.  Constitutional:      Appearance: She is well-developed. She is not ill-appearing.  HENT:     Head: Normocephalic and atraumatic.  Eyes:     Conjunctiva/sclera: Conjunctivae normal.     Comments: Right eye appears to be an exotropia.  Otherwise EOMs are normal.  PERRLA.  Neck:     Musculoskeletal: Normal range of motion and neck supple.  Cardiovascular:     Rate and Rhythm: Normal rate and regular rhythm.     Heart sounds: Normal heart sounds.  Pulmonary:     Effort: Pulmonary effort is normal. No respiratory distress.     Breath sounds: Normal breath sounds.  Abdominal:     General: Bowel sounds are normal.     Palpations: Abdomen is soft.     Tenderness: There is no abdominal tenderness.  Skin:    General: Skin is warm.  Neurological:     Mental Status: She is alert.     Comments: Mental Status:  Alert, oriented, thought content appropriate, able to give a coherent history. Speech fluent without evidence of aphasia. Able to follow 2 step commands without difficulty.  Cranial Nerves:  II:  Peripheral visual fields grossly normal, pupils equal, round, reactive to light III,IV, VI: ptosis not present, extra-ocular motions intact bilaterally  V,VII: smile symmetric, facial light touch sensation equal VIII: hearing grossly normal to voice  X: uvula elevates symmetrically  XI: bilateral shoulder shrug symmetric and strong XII: midline tongue extension without fassiculations Motor:  Normal tone. 5/5 strength in upper and lower extremities bilaterally including strong and equal grip strength and dorsiflexion/plantar flexion Sensory: grossly normal in all extremities.  Cerebellar: normal finger-to-nose with bilateral upper extremities CV: distal pulses palpable throughout     Psychiatric:        Behavior: Behavior normal.      ED Treatments / Results  Labs (all labs ordered are listed, but only abnormal results are displayed) Labs Reviewed  CBC - Abnormal; Notable for the following components:      Result Value   RBC 5.30 (*)    All other  components within normal limits  COMPREHENSIVE METABOLIC PANEL - Abnormal; Notable for the following components:   Total Bilirubin 1.3 (*)    All other components within normal limits  I-STAT CHEM 8, ED - Abnormal; Notable for the following components:   Calcium, Ion 1.09 (*)    TCO2 21 (*)    Hemoglobin 16.0 (*)    HCT 47.0 (*)    All other components within normal limits  SARS CORONAVIRUS 2 (HOSPITAL ORDER, PERFORMED IN [facility])  PROTIME-INR  APTT  DIFFERENTIAL  URINALYSIS, ROUTINE W REFLEX MICROSCOPIC  CBG MONITORING, ED  I-STAT BETA HCG BLOOD, ED ([facility], [facility], [facility] ONLY)    EKG None  Radiology Dg Chest 2 View  Result Date: 01/11/2019 CLINICAL DATA:  28 year old female for admission. EXAM: CHEST - 2 VIEW COMPARISON:  None. FINDINGS: The lungs are clear. There is no pleural effusion pneumothorax. The cardiac silhouette is within normal limits. No acute osseous pathology. IMPRESSION: No active cardiopulmonary disease. Electronically Signed   By: Elgie Collard M.D.   On: 01/11/2019 22:23   Ct Head Wo Contrast  Result Date: 01/11/2019 CLINICAL DATA:  Patient states she has had a headache for a week and a half. Patient lost right vision in her right eye from an "MS attack with optic neuritis". Pt states the same is happening again. She is having blurry vision in the left. Pt went to her eye MD and was told to come here for IV IGE. EXAM: CT HEAD WITHOUT CONTRAST TECHNIQUE: Contiguous axial images were obtained from the base of the skull through the vertex without intravenous contrast. COMPARISON:  08/19/2017 FINDINGS: Brain: No evidence of acute infarction, hemorrhage, hydrocephalus, extra-axial  collection or mass lesion/mass effect. Vascular: No hyperdense vessel or unexpected calcification. Skull: Normal. Negative for fracture or focal lesion. Sinuses/Orbits: Globes and orbits are unremarkable. The visualized sinuses and mastoid air cells are clear. Other: None. IMPRESSION: Normal unenhanced CT scan of the brain. Electronically Signed   By: Amie Portland M.D.   On: 01/11/2019 19:06    Procedures Procedures (including critical care time)  Medications Ordered in ED Medications  sodium chloride flush (NS) 0.9 % injection 3 mL (has no administration in time range)  metoCLOPramide (REGLAN) injection 10 mg (10 mg Intravenous Given 01/11/19 2305)  diphenhydrAMINE (BENADRYL) injection 12.5 mg (12.5 mg Intravenous Given 01/11/19 2258)     Initial Impression / Assessment and Plan / ED Course  I have reviewed the triage vital signs and the nursing notes.  Pertinent labs & imaging results that were available during my care of the patient were reviewed by me and considered in my medical decision making (see chart for details).  Clinical Course as of Jan 11 2336  Thu Jan 11, 2019  2112 Dr. Wilford Corner with neurology to see patient, recommends MRI and medical admission.   [JR]  2233 Dr. Loney Loh with [facility] accepting admission.   [JR]    Clinical Course User Index [JR] Jalyric Kaestner, [place] N, PA-C     Patient presenting with chronic right eye blindness after her optic neuritis of unknown etiology that occurred last year, presenting with onset of left eye blurry vision that began 3 days ago, similar in presentation to right eye optic neuritis.  Seen by Dr. Sherryll Burger today with ophthalmology with exam consistent with optic nerve edema.  She was sent here for further evaluation.  Patient reportedly had extensive work-up during previous admission last year as well as aggressive treatments with IVIG and plasmapheresis  with no improvement and ultimately is blind in the right eye. On exam today, patient has no  peripheral visual field deficit or other abnormal neurologic findings other than the chronic right eye blindness with some exotropia.  Consulted with neurology, Dr.Arora, who recommends medical admission as well as MRI of brain and orbits with and without contrast.  He recommends no medical interventions at this time, such as IV steroids, until MRI and further work-up is completed by neurology.  Consulted medicine for admission, Dr. Loney Lohathore accepting.   Final Clinical Impressions(s) / ED Diagnoses   Final diagnoses:  Bad headache  Optic neuritis, left    ED Discharge Orders    None       Mercedez Boule, [place] N, PA-C 01/11/19 2337    Wynetta FinesMessick, Peter C, MD 01/18/19 2306

## 2019-01-11 NOTE — Consult Note (Signed)
Neurology Consultation  Reason for Consult: Blurred vision in the left eye, headache Referring Physician: Dr. Venetia Night, PA-C  CC: Blurred vision in the left eye, headaches  History is obtained from: Patient, chart review  HPI: Jennifer [name] is a 28 y.o. female past medical history of right-sided optic neuritis with complete visual loss in spite of receiving IV steroids and plasma exchange in May 2019, with concern for multiple sclerosis and incomplete work-up due to last follow-up for insurance reasons, presents to the emergency room for left-sided blurred vision.  She reports a headache since last 1 week and then started seeing blurred from her left eye 3 days ago.  She also complains of pain in her left eye.  She went to her outpatient ophthalmologist, Dr. Sherryll Burger, who noted left eye optic disc edema and concern for optic neuritis in the left eye and sent her to the ER for further evaluation and imaging. Neurological consultation for further work-up and management. She denies any focal neurological deficits of weakness tingling numbness.  Denies any clumsiness.  Denies any difficulty walking. Denies any sick contacts.  Denies fevers chills.  She reports that after her discharge from the hospital in 2019 after receiving steroids and 5 rounds of plasma exchange, she saw Dr. Epimenio Foot at [facility] neurology.  Dr. Bonnita Hollow assessment was that she needed further work-up as she did not meet the McDonald criteria for multiple sclerosis.  Spinal tap was done-opening pressure 27.  CSF was obtained and did not show any oligoclonal bands.  His plan was to see her back in clinic and discuss disease modifying therapy but she never made up the follow-up appointment due to insurance issues.  She has had no improvement in the right eye vision and is completely blind in that eye.   ROS: ROS was performed and is negative except as noted in the HPI  Past Medical History:  Diagnosis Date  . Ectopic  pregnancy    MTX 07/2012  . Optic neuritis 08/19/2017   Right eye    Family History  Problem Relation Age of Onset  . Cancer Maternal Grandmother   . Heart disease Maternal Grandmother   . Breast cancer Maternal Grandmother   . Cancer Maternal Grandfather   . Fibromyalgia Mother   . Other Mother        concussion  . Other Sister        pre-eclampsia  . Lupus Sister   . Kidney disease Sister   . Asthma Son   . Multiple sclerosis Maternal Uncle    Social History:   reports that she has been smoking cigarettes. She has a 5.00 pack-year smoking history. She has never used smokeless tobacco. She reports current alcohol use of about 7.0 standard drinks of alcohol per week. She reports current drug use. Drug: Marijuana.  Medications  Current Facility-Administered Medications:  .  sodium chloride flush (NS) 0.9 % injection 3 mL, 3 mL, Intravenous, Once, Messick, Noralyn Pick, MD  Current Outpatient Medications:  .  acetaminophen (TYLENOL) 650 MG CR tablet, Take 1,300 mg by mouth every 8 (eight) hours as needed (headaches/pain). , Disp: , Rfl:  .  diphenhydrAMINE (BENADRYL) 25 MG tablet, Take 25 mg by mouth daily as needed (headache)., Disp: , Rfl:  .  indomethacin (INDOCIN) 25 MG capsule, Take 1 capsule (25 mg total) by mouth 2 (two) times daily with a meal. (Patient not taking: Reported on 01/11/2019), Disp: 90 capsule, Rfl: 0   Exam: Current vital signs: BP 105/75  Pulse (!) 57   Temp 99 F (37.2 C) (Oral)   Resp 16   Ht 5\' 1"  (1.549 m)   Wt 61.2 kg   LMP 12/20/2018   SpO2 98%   BMI 25.51 kg/m  Vital signs in last 24 hours: Temp:  [99 F (37.2 C)] 99 F (37.2 C) (09/24 1723) Pulse Rate:  [57-62] 57 (09/24 1945) Resp:  [16] 16 (09/24 1945) BP: (105-122)/(75-86) 105/75 (09/24 1945) SpO2:  [98 %-99 %] 98 % (09/24 1945) Weight:  [61.2 kg] 61.2 kg (09/24 1725) General: Awake alert in no distress HEENT: Normocephalic atraumatic dry mucous membranes Lungs: Clear to  auscultation Cardiovascular: Regular rate rhythm Abdomen: Soft nondistended nontender Extremities warm well perfused Neurological exam Awake alert oriented x3 Speech is clear No evidence of dysarthria or aphasia Able to follow commands Cranial nerves: Her pupils are equal round and reactive to light, she does have right eye exotropia, she has only light perception in that right eye inconsistently, left eye visual acuity also difficult to assess due to her recent dilatation at the outpatient appointment but appears to have a superior hemifield defect on the left with the left eye..  Face symmetric.  Facial sensation intact Motor exam: No drift, 5/5 in all 4 extremities. Sensory exam: Intact to light touch all over with no extinction Coordination: Intact finger-nose-finger testing.   Labs I have reviewed labs in epic and the results pertinent to this consultation are:  CBC    Component Value Date/Time   WBC 7.3 01/11/2019 1743   RBC 5.30 (H) 01/11/2019 1743   HGB 16.0 (H) 01/11/2019 1744   HCT 47.0 (H) 01/11/2019 1744   PLT 155 01/11/2019 1743   MCV 86.4 01/11/2019 1743   MCH 28.1 01/11/2019 1743   MCHC 32.5 01/11/2019 1743   RDW 13.2 01/11/2019 1743   LYMPHSABS 3.4 01/11/2019 1743   MONOABS 0.4 01/11/2019 1743   EOSABS 0.0 01/11/2019 1743   BASOSABS 0.0 01/11/2019 1743    CMP     Component Value Date/Time   NA 136 01/11/2019 1744   K 3.5 01/11/2019 1744   CL 105 01/11/2019 1744   CO2 23 01/11/2019 1743   GLUCOSE 88 01/11/2019 1744   BUN 9 01/11/2019 1744   CREATININE 0.70 01/11/2019 1744   CALCIUM 9.1 01/11/2019 1743   PROT 6.9 01/11/2019 1743   ALBUMIN 4.1 01/11/2019 1743   ALBUMIN 4.3 09/15/2017 1218   AST 17 01/11/2019 1743   ALT 18 01/11/2019 1743   ALKPHOS 43 01/11/2019 1743   BILITOT 1.3 (H) 01/11/2019 1743   GFRNONAA >60 01/11/2019 1743   GFRAA >60 01/11/2019 1743   Imaging I have reviewed the images obtained: Head CT done today-unremarkable for acute  process Brain MRI, orbit MRI and cervical spine MRI from 08/19/2017 with scattered nonspecific supratentorial T2/flair hyperintense lesions with no restricted diffusion or enhancement.  Right optic nerve enhancement was seen at that time.  Normal C-spine MRI at the time.  Other labs: CSF 09/15/2017-clear CSF, glucose 57, RBC 1, WBC 2, oligoclonal bands not seen, total protein 33. IgG index normal. ANCA negative.   Assessment: 28 year old with history of right optic neuritis in 2019 with no response to steroids and plasma exchange, with consideration for the event being possible demyelinating disease such as multiple sclerosis, lost to follow-up now presents with left-sided painful visual deficits concerning for left sided optic neuritis. She did have a spinal tap done in the interim for the first follow-up appointment that she made  which showed an increased opening pressure of 27 but the CSF was otherwise unremarkable including absent oligoclonal bands. Brain MRI with scattered nonspecific supratentorial hyperintense lesions with no restricted diffusion or enhancement and normal C-spine imaging-did not meet McDonald's criteria for diagnosis of MS. With the most current presentation concerning for left optic neuritis, further imaging is required to see if she now meets the criteria for MS.  Further treatment based on imaging and discussion with outpatient MS specialist Dr. Felecia Shelling. We will recommend admission for now for further work-up as treatment options are being weighed and treatment plans being made.  Has no outpatient insurance to follow-up outpatient.  Impression: Evaluate for painful left eye visual loss-differentials include left optic neuritis given prior history of right optic neuritis in the last year. Also consider idiopathic intracranial hypertension-pseudotumor cerebri-although opening pressure on last spinal tap was mildly elevated at 27.  Recommendations: MRI brain with and without  contrast MRI orbits with and without contrast Admit to hospitalist Basic labs CBC BMP, UA chest x-ray Neurology team will touch base with Dr. Felecia Shelling in the morning and recommend treatment-considerations would include steroids plus minus plasma exchange. Plan relayed to Martinique Robinson, PA-C in the ER    -- Amie Portland, MD [facility] Pager: [phone] If 7pm to 7am, please call on call as listed on AMION.

## 2019-01-12 ENCOUNTER — Inpatient Hospital Stay (HOSPITAL_COMMUNITY): Payer: Self-pay

## 2019-01-12 DIAGNOSIS — Z114 Encounter for screening for human immunodeficiency virus [HIV]: Secondary | ICD-10-CM

## 2019-01-12 DIAGNOSIS — H469 Unspecified optic neuritis: Secondary | ICD-10-CM

## 2019-01-12 DIAGNOSIS — Z720 Tobacco use: Secondary | ICD-10-CM

## 2019-01-12 LAB — URINALYSIS, ROUTINE W REFLEX MICROSCOPIC
Bilirubin Urine: NEGATIVE
Glucose, UA: NEGATIVE mg/dL
Hgb urine dipstick: NEGATIVE
Ketones, ur: 20 mg/dL — AB
Leukocytes,Ua: NEGATIVE
Nitrite: NEGATIVE
Protein, ur: 30 mg/dL — AB
Specific Gravity, Urine: 1.033 — ABNORMAL HIGH (ref 1.005–1.030)
pH: 5 (ref 5.0–8.0)

## 2019-01-12 LAB — HIV ANTIBODY (ROUTINE TESTING W REFLEX): HIV Screen 4th Generation wRfx: NONREACTIVE

## 2019-01-12 LAB — SARS CORONAVIRUS 2 BY RT PCR (HOSPITAL ORDER, PERFORMED IN ~~LOC~~ HOSPITAL LAB): SARS Coronavirus 2: NEGATIVE

## 2019-01-12 MED ORDER — GADOBUTROL 1 MMOL/ML IV SOLN
6.0000 mL | Freq: Once | INTRAVENOUS | Status: AC | PRN
  Administered 2019-01-12: 6 mL via INTRAVENOUS

## 2019-01-12 MED ORDER — ENOXAPARIN SODIUM 40 MG/0.4ML ~~LOC~~ SOLN
40.0000 mg | SUBCUTANEOUS | Status: DC
  Administered 2019-01-12 – 2019-01-13 (×2): 40 mg via SUBCUTANEOUS
  Filled 2019-01-12 (×3): qty 0.4

## 2019-01-12 MED ORDER — PROCHLORPERAZINE EDISYLATE 10 MG/2ML IJ SOLN: 10.0000 mg | Freq: Four times a day (QID) | INTRAMUSCULAR | Status: DC | PRN

## 2019-01-12 MED ORDER — ACETAMINOPHEN 325 MG PO TABS
650.0000 mg | ORAL_TABLET | Freq: Four times a day (QID) | ORAL | Status: DC | PRN
  Administered 2019-01-12 (×2): 650 mg via ORAL
  Filled 2019-01-12 (×2): qty 2

## 2019-01-12 MED ORDER — SODIUM CHLORIDE 0.9 % IV SOLN
1000.0000 mg | Freq: Every day | INTRAVENOUS | Status: AC
  Administered 2019-01-12 – 2019-01-16 (×5): 1000 mg via INTRAVENOUS
  Filled 2019-01-12 (×5): qty 8

## 2019-01-12 MED ORDER — KETOROLAC TROMETHAMINE 30 MG/ML IJ SOLN: 30.0000 mg | Freq: Four times a day (QID) | INTRAMUSCULAR | Status: DC | PRN

## 2019-01-12 MED ORDER — ACETAMINOPHEN 650 MG RE SUPP: 650.0000 mg | Freq: Four times a day (QID) | RECTAL | Status: DC | PRN

## 2019-01-12 MED ORDER — DIPHENHYDRAMINE HCL 50 MG/ML IJ SOLN: 25.0000 mg | Freq: Four times a day (QID) | INTRAMUSCULAR | Status: DC | PRN

## 2019-01-12 MED ORDER — PANTOPRAZOLE SODIUM 40 MG PO TBEC
40.0000 mg | DELAYED_RELEASE_TABLET | Freq: Every day | ORAL | Status: DC
  Administered 2019-01-12 – 2019-01-16 (×5): 40 mg via ORAL
  Filled 2019-01-12 (×5): qty 1

## 2019-01-12 MED ORDER — NICOTINE 14 MG/24HR TD PT24
14.0000 mg | MEDICATED_PATCH | Freq: Every day | TRANSDERMAL | Status: DC
  Administered 2019-01-13 – 2019-01-14 (×2): 14 mg via TRANSDERMAL
  Filled 2019-01-12 (×5): qty 1

## 2019-01-12 NOTE — Progress Notes (Signed)
MR brain completed. Positive for evidence of left optic neuritis. Also a possible restricting lesion in the left splenium. Will recommend 5 days of IV steroids. Further recs pending AM rounding and discussion with Dr. Felecia Shelling. Discussed with Dr Marlowe Sax - admitting hospitalist. -- Amie Portland, MD [facility] Pager: [phone] If 7pm to 7am, please call on call as listed on AMION.

## 2019-01-12 NOTE — Progress Notes (Signed)
PROGRESS NOTE    Jennifer [name]  XHB:[id number] DOB: 04-04-1991 DOA: 01/11/2019 PCP: Patient, No Pcp Per   Brief Narrative:  28 y.o. female with medical history significant of optic neuritis of the right eye with complete visual loss despite receiving IV steroids and plasma exchange in May 2019, with concern for multiple sclerosis and incomplete work-up due to loss of follow-up for insurance reasons presenting to the hospital for evaluation of headache and blurry vision in the left eye.  She was seen by our ophthalmologist, Dr. [name] and was sent to the emergency department for concern for optic nerve edema. MRI finding showed acute left optic neuritis,scattered patchy T2/FLAIR hyperintensities involving the supratentorial cerebral white matter, highly suspicious for possible demyelinating disease. Also showed a subcentimeter new lesion involving the left Splenium, suspicious for active demyelination.  Patient has been admitted for management with IV steroid for 5 days.  Neurology following.   Assessment & Plan:   Principal Problem:   Optic neuritis, left Active Problems:   Encounter for screening for HIV   Tobacco abuse   Left optic neuritis/concern for MS: Presented with headache, 3 day  history of blurry vision on the left eye.  MRI finding as above.  Started on IV steroid which will be continued for 5 days.  Day 1/5.  Continue supportive care and pain management for headache.  Neurology following.  Tobacco use: Counseled for cessation continue nicotine patch         DVT prophylaxis: Lovenox Code Status: Full Family Communication: None present at the bed side Disposition Plan: Home after completion of steroid course   Consultants: Neurology  Procedures: MRI of the brain  Antimicrobials:  Anti-infectives (From admission, onward)   None      Subjective:  Patient seen and examined the bedside this morning.  Hemodynamically stable.  Still complains of blurry vision on  the left side.  No other associated complaints.  Objective: Vitals:   01/11/19 1945 01/11/19 2313 01/12/19 0000 01/12/19 0208  BP: 105/75 104/65  105/72  Pulse: (!) 57 63 62 61  Resp: 16  (!) 23 16  Temp:    98.5 F (36.9 C)  TempSrc:    Oral  SpO2: 98% 99% 100% 100%  Weight:      Height:       No intake or output data in the 24 hours ending 01/12/19 1257 Filed Weights   01/11/19 1725  Weight: 61.2 kg    Examination:  General exam: Appears calm and comfortable ,Not in distress,average built HEENT: Blurry vision in the left eye, Ear/Nose normal on gross exam Respiratory system: Bilateral equal air entry, normal vesicular breath sounds, no wheezes or crackles  Cardiovascular system: S1 & S2 heard, RRR. No JVD, murmurs, rubs, gallops or clicks. No pedal edema. Gastrointestinal system: Abdomen is nondistended, soft and nontender. No organomegaly or masses felt. Normal bowel sounds heard. Central nervous system: Alert and oriented. No focal neurological deficits. Extremities: No edema, no clubbing ,no cyanosis, distal peripheral pulses palpable. Skin: No rashes, lesions or ulcers,no icterus ,no pallor MSK: Normal muscle bulk,tone ,power Psychiatry: Judgement and insight appear normal. Mood & affect appropriate.     Data Reviewed: I have personally reviewed following labs and imaging studies  CBC: Recent Labs  Lab 01/11/19 1743 01/11/19 1744  WBC 7.3  --   NEUTROABS 3.5  --   HGB 14.9 16.0*  HCT 45.8 47.0*  MCV 86.4  --   PLT 155  --  Basic Metabolic Panel: Recent Labs  Lab 01/11/19 1743 01/11/19 1744  NA 135 136  K 3.5 3.5  CL 102 105  CO2 23  --   GLUCOSE 95 88  BUN 8 9  CREATININE 0.78 0.70  CALCIUM 9.1  --    GFR: Estimated Creatinine Clearance: 87.9 mL/min (by C-G formula based on SCr of 0.7 mg/dL). Liver Function Tests: Recent Labs  Lab 01/11/19 1743  AST 17  ALT 18  ALKPHOS 43  BILITOT 1.3*  PROT 6.9  ALBUMIN 4.1   No results for  input(s): LIPASE, AMYLASE in the last 168 hours. No results for input(s): AMMONIA in the last 168 hours. Coagulation Profile: Recent Labs  Lab 01/11/19 1743  INR 1.0   Cardiac Enzymes: No results for input(s): CKTOTAL, CKMB, CKMBINDEX, TROPONINI in the last 168 hours. BNP (last 3 results) No results for input(s): PROBNP in the last 8760 hours. HbA1C: No results for input(s): HGBA1C in the last 72 hours. CBG: No results for input(s): GLUCAP in the last 168 hours. Lipid Profile: No results for input(s): CHOL, HDL, LDLCALC, TRIG, CHOLHDL, LDLDIRECT in the last 72 hours. Thyroid Function Tests: No results for input(s): TSH, T4TOTAL, FREET4, T3FREE, THYROIDAB in the last 72 hours. Anemia Panel: No results for input(s): VITAMINB12, FOLATE, FERRITIN, TIBC, IRON, RETICCTPCT in the last 72 hours. Sepsis Labs: No results for input(s): PROCALCITON, LATICACIDVEN in the last 168 hours.  Recent Results (from the past 240 hour(s))  SARS Coronavirus 2 [facility](Hospital order, Performed [facility]) Nasopharyngeal Nasopharyngeal Swab     Status: None   Collection Time: 01/11/19 10:51 PM   Specimen: Nasopharyngeal Swab  Result Value Ref Range Status   SARS Coronavirus 2 NEGATIVE NEGATIVE Final    Comment: (NOTE) If result is NEGATIVE SARS-CoV-2 target nucleic acids are NOT DETECTED. The SARS-CoV-2 RNA is generally detectable in upper and lower  respiratory specimens during the acute phase of infection. The lowest  concentration of SARS-CoV-2 viral copies this assay can detect is 250  copies / mL. A negative result does not preclude SARS-CoV-2 infection  and should not be used as the sole basis for treatment or other  patient management decisions.  A negative result may occur with  improper specimen collection / handling, submission of specimen other  than nasopharyngeal swab, presence of viral mutation(s) within the  areas targeted by this assay, and inadequate number of viral copies    (<250 copies / mL). A negative result must be combined with clinical  observations, patient history, and epidemiological information. If result is POSITIVE SARS-CoV-2 target nucleic acids are DETECTED. The SARS-CoV-2 RNA is generally detectable in upper and lower  respiratory specimens dur ing the acute phase of infection.  Positive  results are indicative of active infection with SARS-CoV-2.  Clinical  correlation with patient history and other diagnostic information is  necessary to determine patient infection status.  Positive results do  not rule out bacterial infection or co-infection with other viruses. If result is PRESUMPTIVE POSTIVE SARS-CoV-2 nucleic acids MAY BE PRESENT.   A presumptive positive result was obtained on the submitted specimen  and confirmed on repeat testing.  While 2019 novel coronavirus  (SARS-CoV-2) nucleic acids may be present in the submitted sample  additional confirmatory testing may be necessary for epidemiological  and / or clinical management purposes  to differentiate between  SARS-CoV-2 and other Sarbecovirus currently known to infect humans.  If clinically indicated additional testing with an alternate test  methodology (  [id number]) is advised. The SARS-CoV-2 RNA is generally  detectable in upper and lower respiratory sp ecimens during the acute  phase of infection. The expected result is Negative. Fact Sheet for Patients:  BoilerBrush.com.cy Fact Sheet for Healthcare Providers: https://pope.com/ This test is not yet approved or cleared by the [place] FDA and has been authorized for detection and/or diagnosis of SARS-CoV-2 by FDA under an Emergency Use Authorization (EUA).  This EUA will remain in effect (meaning this test can be used) for the duration of the COVID-19 declaration under Section 564(b)(1) of the Act, 21 U.S.C. section 360bbb-3(b)(1), unless the authorization is terminated  or revoked sooner. Performed at [facility], [address], [place], [place] [address]          Radiology Studies: Dg Chest 2 View  Result Date: 01/11/2019 CLINICAL DATA:  28 year old female for admission. EXAM: CHEST - 2 VIEW COMPARISON:  None. FINDINGS: The lungs are clear. There is no pleural effusion pneumothorax. The cardiac silhouette is within normal limits. No acute osseous pathology. IMPRESSION: No active cardiopulmonary disease. Electronically Signed   By: Elgie Collard M.D.   On: 01/11/2019 22:23   Ct Head Wo Contrast  Result Date: 01/11/2019 CLINICAL DATA:  Patient states she has had a headache for a week and a half. Patient lost right vision in her right eye from an "MS attack with optic neuritis". Pt states the same is happening again. She is having blurry vision in the left. Pt went to her eye MD and was told to come here for IV IGE. EXAM: CT HEAD WITHOUT CONTRAST TECHNIQUE: Contiguous axial images were obtained from the base of the skull through the vertex without intravenous contrast. COMPARISON:  08/19/2017 FINDINGS: Brain: No evidence of acute infarction, hemorrhage, hydrocephalus, extra-axial collection or mass lesion/mass effect. Vascular: No hyperdense vessel or unexpected calcification. Skull: Normal. Negative for fracture or focal lesion. Sinuses/Orbits: Globes and orbits are unremarkable. The visualized sinuses and mastoid air cells are clear. Other: None. IMPRESSION: Normal unenhanced CT scan of the brain. Electronically Signed   By: Amie Portland M.D.   On: 01/11/2019 19:06   Mr Laqueta Jean And Wo Contrast  Result Date: 01/12/2019 CLINICAL DATA:  Initial evaluation for possible left optic neuritis. EXAM: MRI HEAD AND ORBITS WITHOUT AND WITH CONTRAST TECHNIQUE: Multiplanar, multiecho pulse sequences of the brain and surrounding structures were obtained without and with intravenous contrast. Multiplanar, multiecho pulse sequences of the orbits and surrounding  structures were obtained including fat saturation techniques, before and after intravenous contrast administration. CONTRAST:  55mL GADAVIST GADOBUTROL 1 MMOL/ML IV SOLN COMPARISON:  Previous MRI from 08/20/2017. FINDINGS: MRI HEAD FINDINGS Brain: Cerebral volume stable, and remains within normal limits for age. Again seen are a few scattered T2/FLAIR hyperintensities involving the periventricular and subcortical white matter of both cerebral hemispheres, nonspecific, but most suspicious for demyelinating disease/multiple sclerosis. Overall, appearance is minimally progressed relative to 2019 as evidence by AP probable new lesion involving the left splenium, faintly visible on axial FLAIR sequence (series 9, image 14). Associated mild diffusion abnormality suspicious for active demyelination (series 5, image 74), although no definite enhancement seen following contrast administration. Otherwise, the few additional scattered lesions are relatively unchanged. No other evidence for active demyelination. No brainstem or infratentorial involvement. No evidence for acute infarct. Gray-white matter differentiation without evidence for chronic or interval cortical infarction. No evidence for acute or chronic intracranial hemorrhage. No mass lesion, midline shift or mass effect. No hydrocephalus. No extra-axial fluid  collection. Pituitary gland within normal limits. Midline structures intact. Vascular: Major intracranial vascular flow voids are well maintained and normal in appearance. Skull and upper cervical spine: Craniocervical junction within normal limits. Bone marrow signal intensity normal. No scalp soft tissue abnormality. Other: Mastoid air cells are clear. MRI ORBITS FINDINGS Orbits: Globes are symmetric in size with normal appearance and morphology bilaterally. Abnormal edema with irregularity and enhancement seen involving the left optic nerve, consistent with acute optic neuritis (series 25, image 19). Hazy  edema and enhancement seen within the adjacent intraconal fat. Involvement extends to the pre chiasmatic segment. Optic chiasm itself is relatively spared and normal in appearance. Right optic nerve relatively normal in appearance without evidence for acute optic neuritis. Remainder of the orbital structures including the extraocular muscles, lacrimal glands, and superior orbital veins are normal. Visualized sinuses: Visualized paranasal sinuses are clear. Soft tissues: Periorbital soft tissues within normal limits. Delete that IMPRESSION: 1. Findings consistent with acute left optic neuritis. 2. Scattered patchy T2/FLAIR hyperintensities involving the supratentorial cerebral white matter, highly suspicious for possible demyelinating disease. Overall, appearance is minimally progressed from previous, with a subcentimeter new lesion involving the left splenium. Associated mild diffusion abnormality suspicious for active demyelination. Electronically Signed   By: Rise MuBenjamin  McClintock M.D.   On: 01/12/2019 02:19   Mr Rockwell GermanyOrbits W ZOWo Contrast  Result Date: 01/12/2019 CLINICAL DATA:  Initial evaluation for possible left optic neuritis. EXAM: MRI HEAD AND ORBITS WITHOUT AND WITH CONTRAST TECHNIQUE: Multiplanar, multiecho pulse sequences of the brain and surrounding structures were obtained without and with intravenous contrast. Multiplanar, multiecho pulse sequences of the orbits and surrounding structures were obtained including fat saturation techniques, before and after intravenous contrast administration. CONTRAST:  6mL GADAVIST GADOBUTROL 1 MMOL/ML IV SOLN COMPARISON:  Previous MRI from 08/20/2017. FINDINGS: MRI HEAD FINDINGS Brain: Cerebral volume stable, and remains within normal limits for age. Again seen are a few scattered T2/FLAIR hyperintensities involving the periventricular and subcortical white matter of both cerebral hemispheres, nonspecific, but most suspicious for demyelinating disease/multiple  sclerosis. Overall, appearance is minimally progressed relative to 2019 as evidence by AP probable new lesion involving the left splenium, faintly visible on axial FLAIR sequence (series 9, image 14). Associated mild diffusion abnormality suspicious for active demyelination (series 5, image 74), although no definite enhancement seen following contrast administration. Otherwise, the few additional scattered lesions are relatively unchanged. No other evidence for active demyelination. No brainstem or infratentorial involvement. No evidence for acute infarct. Gray-white matter differentiation without evidence for chronic or interval cortical infarction. No evidence for acute or chronic intracranial hemorrhage. No mass lesion, midline shift or mass effect. No hydrocephalus. No extra-axial fluid collection. Pituitary gland within normal limits. Midline structures intact. Vascular: Major intracranial vascular flow voids are well maintained and normal in appearance. Skull and upper cervical spine: Craniocervical junction within normal limits. Bone marrow signal intensity normal. No scalp soft tissue abnormality. Other: Mastoid air cells are clear. MRI ORBITS FINDINGS Orbits: Globes are symmetric in size with normal appearance and morphology bilaterally. Abnormal edema with irregularity and enhancement seen involving the left optic nerve, consistent with acute optic neuritis (series 25, image 19). Hazy edema and enhancement seen within the adjacent intraconal fat. Involvement extends to the pre chiasmatic segment. Optic chiasm itself is relatively spared and normal in appearance. Right optic nerve relatively normal in appearance without evidence for acute optic neuritis. Remainder of the orbital structures including the extraocular muscles, lacrimal glands, and superior orbital veins are normal. Visualized sinuses:  Visualized paranasal sinuses are clear. Soft tissues: Periorbital soft tissues within normal limits. Delete  that IMPRESSION: 1. Findings consistent with acute left optic neuritis. 2. Scattered patchy T2/FLAIR hyperintensities involving the supratentorial cerebral white matter, highly suspicious for possible demyelinating disease. Overall, appearance is minimally progressed from previous, with a subcentimeter new lesion involving the left splenium. Associated mild diffusion abnormality suspicious for active demyelination. Electronically Signed   By: Rise Mu M.D.   On: 01/12/2019 02:19        Scheduled Meds:  enoxaparin (LOVENOX) injection  40 mg Subcutaneous Q24H   nicotine  14 mg Transdermal Daily   pantoprazole  40 mg Oral Q0600   sodium chloride flush  3 mL Intravenous Once   Continuous Infusions:  methylPREDNISolone (SOLU-MEDROL) injection 1,000 mg (01/12/19 0940)     LOS: 1 day    Time spent: 25 mins.More than 50% of that time was spent in counseling and/or coordination of care.      Burnadette Pop, MD [facility] Pager [phone]  If 7PM-7AM, please contact night-coverage [email or web address] Password [facility] 01/12/2019, 12:57 PM

## 2019-01-12 NOTE — ED Notes (Signed)
Pt transported to MRI 

## 2019-01-12 NOTE — ED Notes (Signed)
Report given to 5W RN. All questions answered 

## 2019-01-12 NOTE — Consult Note (Addendum)
Neurology Progress Note    Subjective:: Blurred vision in the left eye, headaches  Brief review of history: Jennifer [name] is a 28 y.o. female with a past medical history of right eye optic neuritis in 2019 with complete vision loss. She reports blurring and flashing lights in her vision of her right eye lasting for about 30 days in 2019. She did not seek medical attention because she thought the vision changes were migraine related. She reports no history of migraine, however she has a familiar member with migraines and she believed that was the cause of her vision changes. She was driving and completely lost vision in her right eye, she then sought medical attention. She was treated with IV steroids and plasma exchange and had no return of vision in her right eye. She was seen by Dr. Felecia Shelling at [facility] and did not meet the McDonald Criteria for multiple sclerosis. She had a spinal tap with an opening pressure of 27. CSF did not show any oligoclonal bands. The plan was for follow-up with Dr. Renne Musca to discuss disease modifying therapy, however she did not follow-up due to insurance issues. She remains completely blind in her right eye.   On Sunday January 14, 2019 she noticed slight blurring of her left eye, but did not think much of it. Then on Monday she reports it feeling like a curtain coming down and the upper half of her eye became very blurry with flashing lights. She feels her vision on the lower half remains at baseline. The blurry vision and flashing lights on the upper half has continued unchanged. She states that she was considered legally blind at baseline prior to this episode and that she has quite blurry vision in her left eye that is normally improved with her glasses. She went to her outpatient opthomologist Dr. [name], who noted left eye optic disc edema and concern for optic neuritis in the left eye and sent her to the ER for further evaluation and imaging.  ROS: ROS was performed and is  negative except as noted in the HPI  Past Medical History:  Diagnosis Date  . Ectopic pregnancy    MTX 07/2012  . Optic neuritis 08/19/2017   Right eye    Family History  Problem Relation Age of Onset  . Cancer Maternal Grandmother   . Heart disease Maternal Grandmother   . Breast cancer Maternal Grandmother   . Cancer Maternal Grandfather   . Fibromyalgia Mother   . Other Mother        concussion  . Other Sister        pre-eclampsia  . Lupus Sister   . Kidney disease Sister   . Asthma Son   . Multiple sclerosis Maternal Uncle    Social History:   reports that she has been smoking cigarettes. She has a 5.00 pack-year smoking history. She has never used smokeless tobacco. She reports current alcohol use of about 7.0 standard drinks of alcohol per week. She reports current drug use. Drug: Marijuana.  Medications  Current Facility-Administered Medications:  .  acetaminophen (TYLENOL) tablet 650 mg, 650 mg, Oral, Q6H PRN, 650 mg at 01/12/19 0252 **OR** acetaminophen (TYLENOL) suppository 650 mg, 650 mg, Rectal, Q6H PRN, Shela Leff, MD .  diphenhydrAMINE (BENADRYL) injection 25 mg, 25 mg, Intravenous, Q6H PRN, Shela Leff, MD .  enoxaparin (LOVENOX) injection 40 mg, 40 mg, Subcutaneous, Q24H, Shela Leff, MD, 40 mg at 01/12/19 0941 .  ketorolac (TORADOL) 30 MG/ML injection 30 mg, 30  mg, Intravenous, Q6H PRN, John Giovanni, MD .  methylPREDNISolone sodium succinate (SOLU-MEDROL) 1,000 mg in sodium chloride 0.9 % 50 mL IVPB, 1,000 mg, Intravenous, Daily, Milon Dikes, MD, Last Rate: 58 mL/hr at 01/12/19 0940, 1,000 mg at 01/12/19 0940 .  nicotine (NICODERM CQ - dosed in mg/24 hours) patch 14 mg, 14 mg, Transdermal, Daily, John Giovanni, MD .  pantoprazole (PROTONIX) EC tablet 40 mg, 40 mg, Oral, Q0600, Milon Dikes, MD, 40 mg at 01/12/19 0528 .  prochlorperazine (COMPAZINE) injection 10 mg, 10 mg, Intravenous, Q6H PRN, John Giovanni, MD .   sodium chloride flush (NS) 0.9 % injection 3 mL, 3 mL, Intravenous, Once, Wynetta Fines, MD   Exam: Current vital signs: BP 105/72 (BP Location: Right Arm)   Pulse 61   Temp 98.5 F (36.9 C) (Oral)   Resp 16   Ht 5\' 1"  (1.549 m)   Wt 61.2 kg   LMP 12/20/2018   SpO2 100%   BMI 25.51 kg/m  Vital signs in last 24 hours: Temp:  [98.5 F (36.9 C)-99 F (37.2 C)] 98.5 F (36.9 C) (09/25 0208) Pulse Rate:  [57-63] 61 (09/25 0208) Resp:  [16-23] 16 (09/25 0208) BP: (104-122)/(65-86) 105/72 (09/25 0208) SpO2:  [98 %-100 %] 100 % (09/25 0208) Weight:  [61.2 kg] 61.2 kg (09/24 1725)  General: Awake alert in no distress HEENT: Normocephalic atraumatic dry mucous membranes Abdomen: Soft nondistended nontender Extremities warm and [name] perfused  Neurological exam Mentation: Awake alert oriented x3. Speech is clear. No evidence of dysarthria or aphasia. Able to follow commands Cranial nerves: Her pupils are equal round and reactive to light. Left eye superior hemifield defect. Blind in right eye. Face symmetric.  Facial sensation intact Motor exam: No drift, 5/5 in all 4 extremities. Sensory exam: Intact to light touch all over with no extinction   Labs I have reviewed labs in epic and the results pertinent to this consultation are:  CBC    Component Value Date/Time   WBC 7.3 01/11/2019 1743   RBC 5.30 (H) 01/11/2019 1743   HGB 16.0 (H) 01/11/2019 1744   HCT 47.0 (H) 01/11/2019 1744   PLT 155 01/11/2019 1743   MCV 86.4 01/11/2019 1743   MCH 28.1 01/11/2019 1743   MCHC 32.5 01/11/2019 1743   RDW 13.2 01/11/2019 1743   LYMPHSABS 3.4 01/11/2019 1743   MONOABS 0.4 01/11/2019 1743   EOSABS 0.0 01/11/2019 1743   BASOSABS 0.0 01/11/2019 1743    CMP     Component Value Date/Time   NA 136 01/11/2019 1744   K 3.5 01/11/2019 1744   CL 105 01/11/2019 1744   CO2 23 01/11/2019 1743   GLUCOSE 88 01/11/2019 1744   BUN 9 01/11/2019 1744   CREATININE 0.70 01/11/2019 1744    CALCIUM 9.1 01/11/2019 1743   PROT 6.9 01/11/2019 1743   ALBUMIN 4.1 01/11/2019 1743   ALBUMIN 4.3 09/15/2017 1218   AST 17 01/11/2019 1743   ALT 18 01/11/2019 1743   ALKPHOS 43 01/11/2019 1743   BILITOT 1.3 (H) 01/11/2019 1743   GFRNONAA >60 01/11/2019 1743   GFRAA >60 01/11/2019 1743   Imaging I have reviewed the images obtained: 1. Head CT 01/11/19 - unremarkable for acute process  2. MRI Head and Orbits 01/12/19: IMPRESSION: 1. Findings consistent with acute left optic neuritis. 2. Scattered patchy T2/FLAIR hyperintensities involving the supratentorial cerebral white matter, highly suspicious for possible demyelinating disease. Overall, appearance is minimally progressed from previous, with a subcentimeter new lesion  involving the left splenium. Associated mild diffusion abnormality suspicious for active demyelination.  Other labs: CSF 09/15/2017-clear CSF, glucose 57, RBC 1, WBC 2, oligoclonal bands not seen, total protein 33. IgG index normal. ANCA negative.  Assessment: 28 year old with history of right optic neuritis in 2019 with no response to steroids and plasma exchange. She was seen at [facility] by Dr. Epimenio Foot, however, she did not meet the McDonald criteria for a diagnosis of MS at that time. Spinal tap showed opening pressure of 27, but the CSF was otherwise unremarkable, including absent oligocolonal bands. She was lost to follow-up and now presents with left eye visual changes. She is concerned that she does not have insurance and is interested in learning her options.  1. Left eye acute optic neuritis. 2. History and MRI findings felt to be consistent with Multiple Sclerosis, with demyelinating lesions and neurological deficits occurring over space and time. MRI brain this admission reveals chronic appearing white matter hyperintensities that are nonspecific; however, there is a new left splenium subsentimeter lesion with increased DWI signal that is suspicious for active  demyelination.  3. It should be noted that the images from her prior MRI cervical spine were personally reviewed by the Neurology attending and that several subtle hyperintensities are seen within the spinal cord, appearing most consistent with chronic demyelinating lesions. No longitudinally extensive lesions suggestive of NMO were seen; however, an anti-aquaporin antibody titer has been ordered. NMO normal in May 2019 at 3 (ref range 0-3).  Recommendations: 1. Recommend arranging follow-up at [facility] with Dr. Epimenio Foot within next 2 weeks to discuss treatment options.  2. Recommend consulting Case Management/Social Work to see the patient, as she has not followed-up in the past due to insurance issues. She may qualify for disability due to her vision, please evaluate for medicare/medicaid eligibility.  3. Smoking cessation discussed, she reports smoking <1/2 pack per day. Marijuana cessation also recommended and she verbalized understanding. She is currently on a nicotine patch.  4. Complete 5 day course of high-dose IV methylprednisolone. Last dose on 9/29.   Cathrine Muster, DNP, FNP-C [facility] Nurse Practitioner  Pager: [phone]  Electronically signed: Dr. Caryl Pina

## 2019-01-12 NOTE — ED Notes (Signed)
Pt returned from MRI °

## 2019-01-13 MED ORDER — ZOLPIDEM TARTRATE 5 MG PO TABS: 5.0000 mg | ORAL_TABLET | Freq: Every evening | ORAL | Status: DC | PRN

## 2019-01-13 NOTE — Progress Notes (Signed)
PROGRESS NOTE    ABILENE LANZI  AJO:[id number] DOB: 02/04/1991 DOA: 01/11/2019 PCP: Patient, No Pcp Per   Brief Narrative:  28 y.o. female with medical history significant of optic neuritis of the right eye with complete visual loss despite receiving IV steroids and plasma exchange in May 2019, with concern for multiple sclerosis and incomplete work-up due to loss of follow-up for insurance reasons presenting to the hospital for evaluation of headache and blurry vision in the left eye.  She was seen by our ophthalmologist, Dr. Sherryll Burger and was sent to the emergency department for concern for optic nerve edema. MRI finding showed acute left optic neuritis,scattered patchy T2/FLAIR hyperintensities involving the supratentorial cerebral white matter, highly suspicious for possible demyelinating disease. Also showed a subcentimeter new lesion involving the left Splenium, suspicious for active demyelination.  Patient has been admitted for management with IV steroid for 5 days.  Neurology following.   Assessment & Plan:   Principal Problem:   Optic neuritis, left Active Problems:   Encounter for screening for HIV   Tobacco abuse   Left optic neuritis/concern for MS: Presented with headache, 3 days  history of blurry vision on the left eye.  MRI finding as above.  Started on IV steroid which will be continued for 5 days.  Day 2/5.  Continue supportive care and pain management for headache.  Neurology following.  Tobacco use: Counseled for cessation continue nicotine patch         DVT prophylaxis: Lovenox Code Status: Full Family Communication: None present at the bed side Disposition Plan: Home after completion of steroid course   Consultants: Neurology  Procedures: MRI of the brain  Antimicrobials:  Anti-infectives (From admission, onward)   None      Subjective:  Patient seen and examined the bedside this morning.  Hemodynamically stable.  She was very concerned because she  could not sleep last night due to noise.  I have promised her to provide something for sleep tonight.  She says her left eye vision is still blurry.  Objective: Vitals:   01/12/19 0208 01/12/19 1859 01/12/19 2108 01/13/19 0444  BP: 105/72 117/83 109/73 108/72  Pulse: 61 78 75 63  Resp: 16 16    Temp: 98.5 F (36.9 C) 98.3 F (36.8 C) 98.3 F (36.8 C) 98.9 F (37.2 C)  TempSrc: Oral Oral Oral Oral  SpO2: 100% 99% 99% 98%  Weight:      Height:        Intake/Output Summary (Last 24 hours) at 01/13/2019 1141 Last data filed at 01/12/2019 1524 Gross per 24 hour  Intake 298 ml  Output --  Net 298 ml   Filed Weights   01/11/19 1725  Weight: 61.2 kg    Examination:  General exam: Appears calm and comfortable ,Not in distress,average built HEENT: Blurry vision in the left eye, Ear/Nose normal on gross exam Respiratory system: Bilateral equal air entry, normal vesicular breath sounds, no wheezes or crackles  Cardiovascular system: S1 & S2 heard, RRR. No JVD, murmurs, rubs, gallops or clicks. No pedal edema. Gastrointestinal system: Abdomen is nondistended, soft and nontender. No organomegaly or masses felt. Normal bowel sounds heard. Central nervous system: Alert and oriented. No focal neurological deficits. Extremities: No edema, no clubbing ,no cyanosis, distal peripheral pulses palpable. Skin: No rashes, lesions or ulcers,no icterus ,no pallor MSK: Normal muscle bulk,tone ,power Psychiatry: Judgement and insight appear normal. Mood & affect appropriate.     Data Reviewed: I have personally reviewed following  labs and imaging studies  CBC: Recent Labs  Lab 01/11/19 1743 01/11/19 1744  WBC 7.3  --   NEUTROABS 3.5  --   HGB 14.9 16.0*  HCT 45.8 47.0*  MCV 86.4  --   PLT 155  --    Basic Metabolic Panel: Recent Labs  Lab 01/11/19 1743 01/11/19 1744  NA 135 136  K 3.5 3.5  CL 102 105  CO2 23  --   GLUCOSE 95 88  BUN 8 9  CREATININE 0.78 0.70  CALCIUM 9.1  --     GFR: Estimated Creatinine Clearance: 87.9 mL/min (by C-G formula based on SCr of 0.7 mg/dL). Liver Function Tests: Recent Labs  Lab 01/11/19 1743  AST 17  ALT 18  ALKPHOS 43  BILITOT 1.3*  PROT 6.9  ALBUMIN 4.1   No results for input(s): LIPASE, AMYLASE in the last 168 hours. No results for input(s): AMMONIA in the last 168 hours. Coagulation Profile: Recent Labs  Lab 01/11/19 1743  INR 1.0   Cardiac Enzymes: No results for input(s): CKTOTAL, CKMB, CKMBINDEX, TROPONINI in the last 168 hours. BNP (last 3 results) No results for input(s): PROBNP in the last 8760 hours. HbA1C: No results for input(s): HGBA1C in the last 72 hours. CBG: No results for input(s): GLUCAP in the last 168 hours. Lipid Profile: No results for input(s): CHOL, HDL, LDLCALC, TRIG, CHOLHDL, LDLDIRECT in the last 72 hours. Thyroid Function Tests: No results for input(s): TSH, T4TOTAL, FREET4, T3FREE, THYROIDAB in the last 72 hours. Anemia Panel: No results for input(s): VITAMINB12, FOLATE, FERRITIN, TIBC, IRON, RETICCTPCT in the last 72 hours. Sepsis Labs: No results for input(s): PROCALCITON, LATICACIDVEN in the last 168 hours.  Recent Results (from the past 240 hour(s))  SARS Coronavirus 2 [facility](Hospital order, Performed [facility]) Nasopharyngeal Nasopharyngeal Swab     Status: None   Collection Time: 01/11/19 10:51 PM   Specimen: Nasopharyngeal Swab  Result Value Ref Range Status   SARS Coronavirus 2 NEGATIVE NEGATIVE Final    Comment: (NOTE) If result is NEGATIVE SARS-CoV-2 target nucleic acids are NOT DETECTED. The SARS-CoV-2 RNA is generally detectable in upper and lower  respiratory specimens during the acute phase of infection. The lowest  concentration of SARS-CoV-2 viral copies this assay can detect is 250  copies / mL. A negative result does not preclude SARS-CoV-2 infection  and should not be used as the sole basis for treatment or other  patient management decisions.   A negative result may occur with  improper specimen collection / handling, submission of specimen other  than nasopharyngeal swab, presence of viral mutation(s) within the  areas targeted by this assay, and inadequate number of viral copies  (<250 copies / mL). A negative result must be combined with clinical  observations, patient history, and epidemiological information. If result is POSITIVE SARS-CoV-2 target nucleic acids are DETECTED. The SARS-CoV-2 RNA is generally detectable in upper and lower  respiratory specimens dur ing the acute phase of infection.  Positive  results are indicative of active infection with SARS-CoV-2.  Clinical  correlation with patient history and other diagnostic information is  necessary to determine patient infection status.  Positive results do  not rule out bacterial infection or co-infection with other viruses. If result is PRESUMPTIVE POSTIVE SARS-CoV-2 nucleic acids MAY BE PRESENT.   A presumptive positive result was obtained on the submitted specimen  and confirmed on repeat testing.  While 2019 novel coronavirus  (SARS-CoV-2) nucleic acids may be  present in the submitted sample  additional confirmatory testing may be necessary for epidemiological  and / or clinical management purposes  to differentiate between  SARS-CoV-2 and other Sarbecovirus currently known to infect humans.  If clinically indicated additional testing with an alternate test  methodology [phone]) is advised. The SARS-CoV-2 RNA is generally  detectable in upper and lower respiratory sp ecimens during the acute  phase of infection. The expected result is Negative. Fact Sheet for Patients:  BoilerBrush.com.cy Fact Sheet for Healthcare Providers: https://pope.com/ This test is not yet approved or cleared by the [place] FDA and has been authorized for detection and/or diagnosis of SARS-CoV-2 by FDA under an Emergency Use  Authorization (EUA).  This EUA will remain in effect (meaning this test can be used) for the duration of the COVID-19 declaration under Section 564(b)(1) of the Act, 21 U.S.C. section 360bbb-3(b)(1), unless the authorization is terminated or revoked sooner. Performed at [facility], [address], [place], [place] [address]          Radiology Studies: Dg Chest 2 View  Result Date: 01/11/2019 CLINICAL DATA:  28 year old female for admission. EXAM: CHEST - 2 VIEW COMPARISON:  None. FINDINGS: The lungs are clear. There is no pleural effusion pneumothorax. The cardiac silhouette is within normal limits. No acute osseous pathology. IMPRESSION: No active cardiopulmonary disease. Electronically Signed   By: Elgie Collard M.D.   On: 01/11/2019 22:23   Ct Head Wo Contrast  Result Date: 01/11/2019 CLINICAL DATA:  Patient states she has had a headache for a week and a half. Patient lost right vision in her right eye from an "MS attack with optic neuritis". Pt states the same is happening again. She is having blurry vision in the left. Pt went to her eye MD and was told to come here for IV IGE. EXAM: CT HEAD WITHOUT CONTRAST TECHNIQUE: Contiguous axial images were obtained from the base of the skull through the vertex without intravenous contrast. COMPARISON:  08/19/2017 FINDINGS: Brain: No evidence of acute infarction, hemorrhage, hydrocephalus, extra-axial collection or mass lesion/mass effect. Vascular: No hyperdense vessel or unexpected calcification. Skull: Normal. Negative for fracture or focal lesion. Sinuses/Orbits: Globes and orbits are unremarkable. The visualized sinuses and mastoid air cells are clear. Other: None. IMPRESSION: Normal unenhanced CT scan of the brain. Electronically Signed   By: Amie Portland M.D.   On: 01/11/2019 19:06   Mr Laqueta Jean And Wo Contrast  Result Date: 01/12/2019 CLINICAL DATA:  Initial evaluation for possible left optic neuritis. EXAM: MRI HEAD AND ORBITS  WITHOUT AND WITH CONTRAST TECHNIQUE: Multiplanar, multiecho pulse sequences of the brain and surrounding structures were obtained without and with intravenous contrast. Multiplanar, multiecho pulse sequences of the orbits and surrounding structures were obtained including fat saturation techniques, before and after intravenous contrast administration. CONTRAST:  74mL GADAVIST GADOBUTROL 1 MMOL/ML IV SOLN COMPARISON:  Previous MRI from 08/20/2017. FINDINGS: MRI HEAD FINDINGS Brain: Cerebral volume stable, and remains within normal limits for age. Again seen are a few scattered T2/FLAIR hyperintensities involving the periventricular and subcortical white matter of both cerebral hemispheres, nonspecific, but most suspicious for demyelinating disease/multiple sclerosis. Overall, appearance is minimally progressed relative to 2019 as evidence by AP probable new lesion involving the left splenium, faintly visible on axial FLAIR sequence (series 9, image 14). Associated mild diffusion abnormality suspicious for active demyelination (series 5, image 74), although no definite enhancement seen following contrast administration. Otherwise, the few additional scattered lesions are relatively unchanged. No  other evidence for active demyelination. No brainstem or infratentorial involvement. No evidence for acute infarct. Gray-white matter differentiation without evidence for chronic or interval cortical infarction. No evidence for acute or chronic intracranial hemorrhage. No mass lesion, midline shift or mass effect. No hydrocephalus. No extra-axial fluid collection. Pituitary gland within normal limits. Midline structures intact. Vascular: Major intracranial vascular flow voids are well maintained and normal in appearance. Skull and upper cervical spine: Craniocervical junction within normal limits. Bone marrow signal intensity normal. No scalp soft tissue abnormality. Other: Mastoid air cells are clear. MRI ORBITS FINDINGS  Orbits: Globes are symmetric in size with normal appearance and morphology bilaterally. Abnormal edema with irregularity and enhancement seen involving the left optic nerve, consistent with acute optic neuritis (series 25, image 19). Hazy edema and enhancement seen within the adjacent intraconal fat. Involvement extends to the pre chiasmatic segment. Optic chiasm itself is relatively spared and normal in appearance. Right optic nerve relatively normal in appearance without evidence for acute optic neuritis. Remainder of the orbital structures including the extraocular muscles, lacrimal glands, and superior orbital veins are normal. Visualized sinuses: Visualized paranasal sinuses are clear. Soft tissues: Periorbital soft tissues within normal limits. Delete that IMPRESSION: 1. Findings consistent with acute left optic neuritis. 2. Scattered patchy T2/FLAIR hyperintensities involving the supratentorial cerebral white matter, highly suspicious for possible demyelinating disease. Overall, appearance is minimally progressed from previous, with a subcentimeter new lesion involving the left splenium. Associated mild diffusion abnormality suspicious for active demyelination. Electronically Signed   By: Jeannine Boga M.D.   On: 01/12/2019 02:19   Mr Rosealee Albee [place] Contrast  Result Date: 01/12/2019 CLINICAL DATA:  Initial evaluation for possible left optic neuritis. EXAM: MRI HEAD AND ORBITS WITHOUT AND WITH CONTRAST TECHNIQUE: Multiplanar, multiecho pulse sequences of the brain and surrounding structures were obtained without and with intravenous contrast. Multiplanar, multiecho pulse sequences of the orbits and surrounding structures were obtained including fat saturation techniques, before and after intravenous contrast administration. CONTRAST:  55mL GADAVIST GADOBUTROL 1 MMOL/ML IV SOLN COMPARISON:  Previous MRI from 08/20/2017. FINDINGS: MRI HEAD FINDINGS Brain: Cerebral volume stable, and remains within normal  limits for age. Again seen are a few scattered T2/FLAIR hyperintensities involving the periventricular and subcortical white matter of both cerebral hemispheres, nonspecific, but most suspicious for demyelinating disease/multiple sclerosis. Overall, appearance is minimally progressed relative to 2019 as evidence by AP probable new lesion involving the left splenium, faintly visible on axial FLAIR sequence (series 9, image 14). Associated mild diffusion abnormality suspicious for active demyelination (series 5, image 74), although no definite enhancement seen following contrast administration. Otherwise, the few additional scattered lesions are relatively unchanged. No other evidence for active demyelination. No brainstem or infratentorial involvement. No evidence for acute infarct. Gray-white matter differentiation without evidence for chronic or interval cortical infarction. No evidence for acute or chronic intracranial hemorrhage. No mass lesion, midline shift or mass effect. No hydrocephalus. No extra-axial fluid collection. Pituitary gland within normal limits. Midline structures intact. Vascular: Major intracranial vascular flow voids are well maintained and normal in appearance. Skull and upper cervical spine: Craniocervical junction within normal limits. Bone marrow signal intensity normal. No scalp soft tissue abnormality. Other: Mastoid air cells are clear. MRI ORBITS FINDINGS Orbits: Globes are symmetric in size with normal appearance and morphology bilaterally. Abnormal edema with irregularity and enhancement seen involving the left optic nerve, consistent with acute optic neuritis (series 25, image 19). Hazy edema and enhancement seen within the adjacent intraconal fat. Involvement extends  to the pre chiasmatic segment. Optic chiasm itself is relatively spared and normal in appearance. Right optic nerve relatively normal in appearance without evidence for acute optic neuritis. Remainder of the orbital  structures including the extraocular muscles, lacrimal glands, and superior orbital veins are normal. Visualized sinuses: Visualized paranasal sinuses are clear. Soft tissues: Periorbital soft tissues within normal limits. Delete that IMPRESSION: 1. Findings consistent with acute left optic neuritis. 2. Scattered patchy T2/FLAIR hyperintensities involving the supratentorial cerebral white matter, highly suspicious for possible demyelinating disease. Overall, appearance is minimally progressed from previous, with a subcentimeter new lesion involving the left splenium. Associated mild diffusion abnormality suspicious for active demyelination. Electronically Signed   By: Rise MuBenjamin  McClintock M.D.   On: 01/12/2019 02:19        Scheduled Meds:  enoxaparin (LOVENOX) injection  40 mg Subcutaneous Q24H   nicotine  14 mg Transdermal Daily   pantoprazole  40 mg Oral Q0600   sodium chloride flush  3 mL Intravenous Once   Continuous Infusions:  methylPREDNISolone (SOLU-MEDROL) injection 1,000 mg (01/13/19 0908)     LOS: 2 days    Time spent: 25 mins.More than 50% of that time was spent in counseling and/or coordination of care.      Burnadette PopAmrit Godric Lavell, MD [facility] Pager [phone]  If 7PM-7AM, please contact night-coverage [email or web address] Password [email or web address] 01/13/2019, 11:41 AM

## 2019-01-14 NOTE — Progress Notes (Signed)
PROGRESS NOTE    Jennifer [name]  ZOX:[id number] DOB: November 19, 1990 DOA: 01/11/2019 PCP: Patient, No Pcp Per   Brief Narrative:  28 y.o. female with medical history significant of optic neuritis of the right eye with complete visual loss despite receiving IV steroids and plasma exchange in May 2019, with concern for multiple sclerosis and incomplete work-up due to loss of follow-up for insurance reasons presenting to the hospital for evaluation of headache and blurry vision in the left eye.  She was seen by our ophthalmologist, Dr. Sherryll BurgerShah and was sent to the emergency department for concern for optic nerve edema. MRI finding showed acute left optic neuritis,scattered patchy T2/FLAIR hyperintensities involving the supratentorial cerebral white matter, highly suspicious for possible demyelinating disease. Also showed a subcentimeter new lesion involving the left Splenium, suspicious for active demyelination.  Patient has been admitted for management with IV steroid for 5 days.  Neurology following.   Assessment & Plan:   Principal Problem:   Optic neuritis, left Active Problems:   Encounter for screening for HIV   Tobacco abuse   Left optic neuritis/concern for MS: Presented with headache, 3 days  history of blurry vision on the left eye.  MRI finding as above.  Started on IV steroid which will be continued for 5 days.  Day 3/5.  Continue supportive care and pain management for headache.  Neurology following.  Tobacco use: Counseled for cessation continue nicotine patch         DVT prophylaxis: Lovenox Code Status: Full Family Communication: None present at the bed side Disposition Plan: Home after completion of steroid course   Consultants: Neurology  Procedures: MRI of the brain  Antimicrobials:  Anti-infectives (From admission, onward)   None      Subjective:  Patient seen and examined the bedside this morning.  Looked comfortable today.  Did not complain of any  headache.  Slept well last night.  Left vision is still blurry.  She was requesting me to let her  go out of the floor with her husband for which I said I am not sure.  Objective: Vitals:   01/13/19 0444 01/13/19 1406 01/13/19 2114 01/14/19 0458  BP: 108/72 125/72 110/75 109/76  Pulse: 63 70 63 (!) 58  Resp:  20 18 18   Temp: 98.9 F (37.2 C) 98.7 F (37.1 C) 99.1 F (37.3 C) 98.7 F (37.1 C)  TempSrc: Oral Oral Oral Oral  SpO2: 98% 94% 99% 98%  Weight:      Height:        Intake/Output Summary (Last 24 hours) at 01/14/2019 1323 Last data filed at 01/13/2019 1700 Gross per 24 hour  Intake 240 ml  Output -  Net 240 ml   Filed Weights   01/11/19 1725  Weight: 61.2 kg    Examination:  General exam: Appears calm and comfortable ,Not in distress,average built HEENT: Blurry vision in the left eye, Ear/Nose normal on gross exam Respiratory system: Bilateral equal air entry, normal vesicular breath sounds, no wheezes or crackles  Cardiovascular system: S1 & S2 heard, RRR. No JVD, murmurs, rubs, gallops or clicks. No pedal edema. Gastrointestinal system: Abdomen is nondistended, soft and nontender. No organomegaly or masses felt. Normal bowel sounds heard. Central nervous system: Alert and oriented. No focal neurological deficits. Extremities: No edema, no clubbing ,no cyanosis, distal peripheral pulses palpable. Skin: No rashes, lesions or ulcers,no icterus ,no pallor     Data Reviewed: I have personally reviewed following labs and imaging studies  CBC:  Recent Labs  Lab 01/11/19 1743 01/11/19 1744  WBC 7.3  --   NEUTROABS 3.5  --   HGB 14.9 16.0*  HCT 45.8 47.0*  MCV 86.4  --   PLT 155  --    Basic Metabolic Panel: Recent Labs  Lab 01/11/19 1743 01/11/19 1744  NA 135 136  K 3.5 3.5  CL 102 105  CO2 23  --   GLUCOSE 95 88  BUN 8 9  CREATININE 0.78 0.70  CALCIUM 9.1  --    GFR: Estimated Creatinine Clearance: 87.9 mL/min (by C-G formula based on SCr of 0.7  mg/dL). Liver Function Tests: Recent Labs  Lab 01/11/19 1743  AST 17  ALT 18  ALKPHOS 43  BILITOT 1.3*  PROT 6.9  ALBUMIN 4.1   No results for input(s): LIPASE, AMYLASE in the last 168 hours. No results for input(s): AMMONIA in the last 168 hours. Coagulation Profile: Recent Labs  Lab 01/11/19 1743  INR 1.0   Cardiac Enzymes: No results for input(s): CKTOTAL, CKMB, CKMBINDEX, TROPONINI in the last 168 hours. BNP (last 3 results) No results for input(s): PROBNP in the last 8760 hours. HbA1C: No results for input(s): HGBA1C in the last 72 hours. CBG: No results for input(s): GLUCAP in the last 168 hours. Lipid Profile: No results for input(s): CHOL, HDL, LDLCALC, TRIG, CHOLHDL, LDLDIRECT in the last 72 hours. Thyroid Function Tests: No results for input(s): TSH, T4TOTAL, FREET4, T3FREE, THYROIDAB in the last 72 hours. Anemia Panel: No results for input(s): VITAMINB12, FOLATE, FERRITIN, TIBC, IRON, RETICCTPCT in the last 72 hours. Sepsis Labs: No results for input(s): PROCALCITON, LATICACIDVEN in the last 168 hours.  Recent Results (from the past 240 hour(s))  SARS Coronavirus 2 [facility] order, Performed in [facility] hospital lab) Nasopharyngeal Nasopharyngeal Swab     Status: None   Collection Time: 01/11/19 10:51 PM   Specimen: Nasopharyngeal Swab  Result Value Ref Range Status   SARS Coronavirus 2 NEGATIVE NEGATIVE Final    Comment: (NOTE) If result is NEGATIVE SARS-CoV-2 target nucleic acids are NOT DETECTED. The SARS-CoV-2 RNA is generally detectable in upper and lower  respiratory specimens during the acute phase of infection. The lowest  concentration of SARS-CoV-2 viral copies this assay can detect is 250  copies / mL. A negative result does not preclude SARS-CoV-2 infection  and should not be used as the sole basis for treatment or other  patient management decisions.  A negative result may occur with  improper specimen collection / handling, submission of  specimen other  than nasopharyngeal swab, presence of viral mutation(s) within the  areas targeted by this assay, and inadequate number of viral copies  (<250 copies / mL). A negative result must be combined with clinical  observations, patient history, and epidemiological information. If result is POSITIVE SARS-CoV-2 target nucleic acids are DETECTED. The SARS-CoV-2 RNA is generally detectable in upper and lower  respiratory specimens dur ing the acute phase of infection.  Positive  results are indicative of active infection with SARS-CoV-2.  Clinical  correlation with patient history and other diagnostic information is  necessary to determine patient infection status.  Positive results do  not rule out bacterial infection or co-infection with other viruses. If result is PRESUMPTIVE POSTIVE SARS-CoV-2 nucleic acids MAY BE PRESENT.   A presumptive positive result was obtained on the submitted specimen  and confirmed on repeat testing.  While 2019 novel coronavirus  (SARS-CoV-2) nucleic acids may be present in the submitted sample  additional confirmatory testing may be necessary for epidemiological  and / or clinical management purposes  to differentiate between  SARS-CoV-2 and other Sarbecovirus currently known to infect humans.  If clinically indicated additional testing with an alternate test  methodology [phone]) is advised. The SARS-CoV-2 RNA is generally  detectable in upper and lower respiratory sp ecimens during the acute  phase of infection. The expected result is Negative. Fact Sheet for Patients:  StrictlyIdeas.no Fact Sheet for Healthcare Providers: BankingDealers.co.za This test is not yet approved or cleared by the [place] FDA and has been authorized for detection and/or diagnosis of SARS-CoV-2 by FDA under an Emergency Use Authorization (EUA).  This EUA will remain in effect (meaning this test can be used) for the  duration of the COVID-19 declaration under Section 564(b)(1) of the Act, 21 U.S.C. section 360bbb-3(b)(1), unless the authorization is terminated or revoked sooner. Performed at [facility], [place] [address], [place], [place] [address]          Radiology Studies: No results found.      Scheduled Meds: . enoxaparin (LOVENOX) injection  40 mg Subcutaneous Q24H  . nicotine  14 mg Transdermal Daily  . pantoprazole  40 mg Oral Q0600  . sodium chloride flush  3 mL Intravenous Once   Continuous Infusions: . methylPREDNISolone (SOLU-MEDROL) injection 1,000 mg (01/14/19 0842)     LOS: 3 days    Time spent: 25 mins.More than 50% of that time was spent in counseling and/or coordination of care.      Shelly Coss, MD [facility] Pager [phone]  If 7PM-7AM, please contact night-coverage [email or web address] Password [email or web address] 01/14/2019, 1:23 PM

## 2019-01-15 LAB — NEUROMYELITIS OPTICA AUTOAB, IGG: NMO-IgG: 2.4 U/mL (ref 0.0–3.0)

## 2019-01-15 MED ORDER — CEFAZOLIN SODIUM-DEXTROSE 2-4 GM/100ML-% IV SOLN
2.0000 g | Freq: Once | INTRAVENOUS | Status: AC
  Administered 2019-01-16: 2 g via INTRAVENOUS

## 2019-01-15 MED ORDER — ENOXAPARIN SODIUM 40 MG/0.4ML ~~LOC~~ SOLN: 40.0000 mg | SUBCUTANEOUS | Status: DC

## 2019-01-15 NOTE — Progress Notes (Addendum)
NEUROLOGY PROGRESS NOTE  Subjective: Patient states that she does not feel she has any improvement in her site.  She does state that her headache has resolved.  Exam: Vitals:   01/14/19 2137 01/15/19 0603  BP: (!) 109/57 (!) 114/58  Pulse: (!) 53 62  Resp: 16 16  Temp: 98.1 F (36.7 C) 98.3 F (36.8 C)  SpO2: 98% 98%    Physical Exam   HEENT-  Normocephalic, no lesions, without obvious abnormality.  Normal external eye and conjunctiva.   Extremities- Warm, dry and intact Musculoskeletal-no joint tenderness, deformity or swelling Skin-warm and dry, no hyperpigmentation, vitiligo, or suspicious lesions    Neuro:  Mental Status: Alert, oriented, thought content appropriate.  Speech fluent without evidence of aphasia.  Able to follow 3 step commands without difficulty. 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 III,IV, VI: ptosis not present, extra-ocular motions intact bilaterally-when looking straight forward patient does have exotropia of her right eye.  Pupils equal, round, reactive to light with APD in her right eye V,VII: smile symmetric with mild left facial droop at rest, facial light touch sensation normal bilaterally VIII: hearing normal bilaterally IX,X: Palate rises midline XI: bilateral shoulder shrug XII: midline tongue extension Motor: Right : Upper extremity   5/5    Left:     Upper extremity   5/5  Lower extremity   5/5     Lower extremity   5/5 Tone and bulk:normal tone throughout; no atrophy noted Sensory: Pinprick and light touch intact throughout, bilaterally Deep Tendon Reflexes: 2+ and symmetric throughout   Medications:  Scheduled: . enoxaparin (LOVENOX) injection  40 mg Subcutaneous Q24H  . nicotine  14 mg Transdermal Daily  . pantoprazole  40 mg Oral Q0600  . sodium chloride flush  3 mL Intravenous Once    Pertinent Labs/Diagnostics: NMO optica autoab, IgG pending     Etta Quill  PA-C [facility] [phone]   Assessment: 28 year old female with history of right optic neuritis in 2019 with no response to steroids and/or plasma exchange.  MRI of brain on this admission reveals chronic appearing white matter hyperintensities also lesions that are consistent with multiple sclerosis.  In addition as noted prior, MRIs that were obtained on prior events of cervical spine showed several subtle hyperdensities in the spinal cord appearing most consistent with chronic demyelinating lesions.  However there was no longitudinally extensive lesions suggestive of NMO.   She has not had improvement in vision and given her previously affected eye, would favor a more aggressive treatment approach. I will order plasmapheresis This could be started in house, but could be continued as outpatient.    Impression:  -Optic neuritis of left eye  Recommendations: --Last dose solumedrol tomorrow --Start plasmapheresis x 5 treatments QOD  Roland Rack, MD [facility] [phone]  If 7pm- 7am, please page neurology on call as listed in [place].  01/15/2019, 11:15 AM

## 2019-01-15 NOTE — Progress Notes (Signed)
PROGRESS NOTE    Jennifer [name]  ZOX:[id number] DOB: 06-Apr-1991 DOA: 01/11/2019 PCP: Patient, No Pcp Per   Brief Narrative:  28 y.o. female with medical history significant of optic neuritis of the right eye with complete visual loss despite receiving IV steroids and plasma exchange in May 2019, with concern for multiple sclerosis and incomplete work-up due to loss of follow-up for insurance reasons presenting to the hospital for evaluation of headache and blurry vision in the left eye.  She was seen by our ophthalmologist, Dr. [name] and was sent to the emergency department for concern for optic nerve edema. MRI finding showed acute left optic neuritis,scattered patchy T2/FLAIR hyperintensities involving the supratentorial cerebral white matter, highly suspicious for possible demyelinating disease. Also showed a subcentimeter new lesion involving the left Splenium, suspicious for active demyelination.  Patient has been admitted for management with IV steroid for 5 days.  Neurology following.   Assessment & Plan:   Principal Problem:   Optic neuritis, left Active Problems:   Encounter for screening for HIV   Tobacco abuse   Left optic neuritis/concern for MS: Presented with headache, 3 days  history of blurry vision on the left eye.  MRI finding as above.  Started on IV steroid which will be continued for 5 days.  Day 4/5.  Continue supportive care and pain management for headache.  Neurology following.  Tobacco use: Counseled for cessation continue nicotine patch         DVT prophylaxis: Lovenox Code Status: Full Family Communication: None present at the bed side Disposition Plan: Home after completion of steroid course,tomorrow   Consultants: Neurology  Procedures: MRI of the brain  Antimicrobials:  Anti-infectives (From admission, onward)   None      Subjective:  Patient seen and examined the bedside this morning.  Hemodynamically stable.  Denies any headache.   Left eye still blurry.  No new problems  Objective: Vitals:   01/13/19 2114 01/14/19 0458 01/14/19 2137 01/15/19 0603  BP: 110/75 109/76 (!) 109/57 (!) 114/58  Pulse: 63 (!) 58 (!) 53 62  Resp: 18 18 16 16   Temp: 99.1 F (37.3 C) 98.7 F (37.1 C) 98.1 F (36.7 C) 98.3 F (36.8 C)  TempSrc: Oral Oral Oral Oral  SpO2: 99% 98% 98% 98%  Weight:      Height:        Intake/Output Summary (Last 24 hours) at 01/15/2019 1313 Last data filed at 01/14/2019 1900 Gross per 24 hour  Intake 240 ml  Output -  Net 240 ml   Filed Weights   01/11/19 1725  Weight: 61.2 kg    Examination:  General exam: Appears calm and comfortable ,Not in distress,average built HEENT: Blurry vision in the left eye, Ear/Nose normal on gross exam Respiratory system: Bilateral equal air entry, normal vesicular breath sounds, no wheezes or crackles  Cardiovascular system: S1 & S2 heard, RRR. No JVD, murmurs, rubs, gallops or clicks. No pedal edema. Gastrointestinal system: Abdomen is nondistended, soft and nontender. No organomegaly or masses felt. Normal bowel sounds heard. Central nervous system: Alert and oriented. No focal neurological deficits. Extremities: No edema, no clubbing ,no cyanosis, distal peripheral pulses palpable. Skin: No rashes, lesions or ulcers,no icterus ,no pallor     Data Reviewed: I have personally reviewed following labs and imaging studies  CBC: Recent Labs  Lab 01/11/19 1743 01/11/19 1744  WBC 7.3  --   NEUTROABS 3.5  --   HGB 14.9 16.0*  HCT 45.8 47.0*  MCV 86.4  --   PLT 155  --    Basic Metabolic Panel: Recent Labs  Lab 01/11/19 1743 01/11/19 1744  NA 135 136  K 3.5 3.5  CL 102 105  CO2 23  --   GLUCOSE 95 88  BUN 8 9  CREATININE 0.78 0.70  CALCIUM 9.1  --    GFR: Estimated Creatinine Clearance: 87.9 mL/min (by C-G formula based on SCr of 0.7 mg/dL). Liver Function Tests: Recent Labs  Lab 01/11/19 1743  AST 17  ALT 18  ALKPHOS 43  BILITOT 1.3*   PROT 6.9  ALBUMIN 4.1   No results for input(s): LIPASE, AMYLASE in the last 168 hours. No results for input(s): AMMONIA in the last 168 hours. Coagulation Profile: Recent Labs  Lab 01/11/19 1743  INR 1.0   Cardiac Enzymes: No results for input(s): CKTOTAL, CKMB, CKMBINDEX, TROPONINI in the last 168 hours. BNP (last 3 results) No results for input(s): PROBNP in the last 8760 hours. HbA1C: No results for input(s): HGBA1C in the last 72 hours. CBG: No results for input(s): GLUCAP in the last 168 hours. Lipid Profile: No results for input(s): CHOL, HDL, LDLCALC, TRIG, CHOLHDL, LDLDIRECT in the last 72 hours. Thyroid Function Tests: No results for input(s): TSH, T4TOTAL, FREET4, T3FREE, THYROIDAB in the last 72 hours. Anemia Panel: No results for input(s): VITAMINB12, FOLATE, FERRITIN, TIBC, IRON, RETICCTPCT in the last 72 hours. Sepsis Labs: No results for input(s): PROCALCITON, LATICACIDVEN in the last 168 hours.  Recent Results (from the past 240 hour(s))  SARS Coronavirus 2 [facility] order, Performed in [facility] hospital lab) Nasopharyngeal Nasopharyngeal Swab     Status: None   Collection Time: 01/11/19 10:51 PM   Specimen: Nasopharyngeal Swab  Result Value Ref Range Status   SARS Coronavirus 2 NEGATIVE NEGATIVE Final    Comment: (NOTE) If result is NEGATIVE SARS-CoV-2 target nucleic acids are NOT DETECTED. The SARS-CoV-2 RNA is generally detectable in upper and lower  respiratory specimens during the acute phase of infection. The lowest  concentration of SARS-CoV-2 viral copies this assay can detect is 250  copies / mL. A negative result does not preclude SARS-CoV-2 infection  and should not be used as the sole basis for treatment or other  patient management decisions.  A negative result may occur with  improper specimen collection / handling, submission of specimen other  than nasopharyngeal swab, presence of viral mutation(s) within the  areas targeted by this  assay, and inadequate number of viral copies  (<250 copies / mL). A negative result must be combined with clinical  observations, patient history, and epidemiological information. If result is POSITIVE SARS-CoV-2 target nucleic acids are DETECTED. The SARS-CoV-2 RNA is generally detectable in upper and lower  respiratory specimens dur ing the acute phase of infection.  Positive  results are indicative of active infection with SARS-CoV-2.  Clinical  correlation with patient history and other diagnostic information is  necessary to determine patient infection status.  Positive results do  not rule out bacterial infection or co-infection with other viruses. If result is PRESUMPTIVE POSTIVE SARS-CoV-2 nucleic acids MAY BE PRESENT.   A presumptive positive result was obtained on the submitted specimen  and confirmed on repeat testing.  While 2019 novel coronavirus  (SARS-CoV-2) nucleic acids may be present in the submitted sample  additional confirmatory testing may be necessary for epidemiological  and / or clinical management purposes  to differentiate between  SARS-CoV-2 and other Sarbecovirus currently known to infect humans.  If clinically indicated additional testing with an alternate test  methodology [phone]) is advised. The SARS-CoV-2 RNA is generally  detectable in upper and lower respiratory sp ecimens during the acute  phase of infection. The expected result is Negative. Fact Sheet for Patients:  BoilerBrush.com.cy Fact Sheet for Healthcare Providers: https://pope.com/ This test is not yet approved or cleared by the [place] FDA and has been authorized for detection and/or diagnosis of SARS-CoV-2 by FDA under an Emergency Use Authorization (EUA).  This EUA will remain in effect (meaning this test can be used) for the duration of the COVID-19 declaration under Section 564(b)(1) of the Act, 21 U.S.C. section 360bbb-3(b)(1),  unless the authorization is terminated or revoked sooner. Performed at [facility], [address] [place], [place] [address]          Radiology Studies: No results found.      Scheduled Meds: . enoxaparin (LOVENOX) injection  40 mg Subcutaneous Q24H  . nicotine  14 mg Transdermal Daily  . pantoprazole  40 mg Oral Q0600  . sodium chloride flush  3 mL Intravenous Once   Continuous Infusions: . methylPREDNISolone (SOLU-MEDROL) injection 1,000 mg (01/15/19 0856)     LOS: 4 days    Time spent: 25 mins.More than 50% of that time was spent in counseling and/or coordination of care.      Burnadette Pop, MD [facility] Pager [phone]  If 7PM-7AM, please contact night-coverage [email or web address] Password [email or web address] 01/15/2019, 1:13 PM

## 2019-01-16 ENCOUNTER — Encounter (HOSPITAL_COMMUNITY): Payer: Self-pay | Admitting: Interventional Radiology

## 2019-01-16 ENCOUNTER — Inpatient Hospital Stay (HOSPITAL_COMMUNITY): Payer: Self-pay

## 2019-01-16 HISTORY — PX: IR US GUIDE VASC ACCESS RIGHT: IMG2390

## 2019-01-16 HISTORY — PX: IR FLUORO GUIDE CV LINE RIGHT: IMG2283

## 2019-01-16 LAB — BASIC METABOLIC PANEL
Anion gap: 9 (ref 5–15)
BUN: 12 mg/dL (ref 6–20)
CO2: 27 mmol/L (ref 22–32)
Calcium: 8.9 mg/dL (ref 8.9–10.3)
Chloride: 100 mmol/L (ref 98–111)
Creatinine, Ser: 0.88 mg/dL (ref 0.44–1.00)
GFR calc Af Amer: >60 mL/min (ref >60)
GFR calc non Af Amer: >60 mL/min (ref >60)
Glucose, Bld: 129 mg/dL — ABNORMAL HIGH (ref 70–99)
Potassium: 3.9 mmol/L (ref 3.5–5.1)
Sodium: 136 mmol/L (ref 135–145)

## 2019-01-16 LAB — CBC
HCT: 43.6 % (ref 36.0–46.0)
Hemoglobin: 14.7 g/dL (ref 12.0–15.0)
MCH: 28.3 pg (ref 26.0–34.0)
MCHC: 33.7 g/dL (ref 30.0–36.0)
MCV: 83.8 fL (ref 80.0–100.0)
Platelets: 148 10*3/uL — ABNORMAL LOW (ref 150–400)
RBC: 5.2 MIL/uL — ABNORMAL HIGH (ref 3.87–5.11)
RDW: 12.9 % (ref 11.5–15.5)
WBC: 7.8 10*3/uL (ref 4.0–10.5)
nRBC: 0 % (ref 0.0–0.2)

## 2019-01-16 MED ORDER — LIDOCAINE HCL (PF) 1 % IJ SOLN
INTRAMUSCULAR | Status: AC | PRN
  Administered 2019-01-16: 10 mL

## 2019-01-16 MED ORDER — CALCIUM CARBONATE ANTACID 500 MG PO CHEW
2.0000 | CHEWABLE_TABLET | ORAL | Status: DC
  Administered 2019-01-16: 400 mg via ORAL

## 2019-01-16 MED ORDER — ACD FORMULA A 0.73-2.45-2.2 GM/100ML VI SOLN
500.0000 mL | Status: DC
  Filled 2019-01-16: qty 500

## 2019-01-16 MED ORDER — ACETAMINOPHEN 325 MG PO TABS
ORAL_TABLET | ORAL | Status: AC
  Filled 2019-01-16: qty 2

## 2019-01-16 MED ORDER — CALCIUM CARBONATE ANTACID 500 MG PO CHEW
CHEWABLE_TABLET | ORAL | Status: AC
  Filled 2019-01-16: qty 4

## 2019-01-16 MED ORDER — FENTANYL CITRATE (PF) 100 MCG/2ML IJ SOLN
INTRAMUSCULAR | Status: AC | PRN
  Administered 2019-01-16: 50 ug via INTRAVENOUS
  Administered 2019-01-16: 25 ug via INTRAVENOUS

## 2019-01-16 MED ORDER — DIPHENHYDRAMINE HCL 25 MG PO CAPS
ORAL_CAPSULE | ORAL | Status: AC
  Filled 2019-01-16: qty 1

## 2019-01-16 MED ORDER — SODIUM CHLORIDE 0.9 % IV SOLN
2.0000 g | Freq: Once | INTRAVENOUS | Status: DC
  Filled 2019-01-16: qty 20

## 2019-01-16 MED ORDER — SODIUM CHLORIDE 0.9 % IV SOLN
2.0000 g | Freq: Once | INTRAVENOUS | Status: AC
  Administered 2019-01-16: 2 g via INTRAVENOUS
  Filled 2019-01-16: qty 20

## 2019-01-16 MED ORDER — CALCIUM CARBONATE ANTACID 500 MG PO CHEW: 2.0000 | CHEWABLE_TABLET | ORAL | Status: DC

## 2019-01-16 MED ORDER — HEPARIN SODIUM (PORCINE) 1000 UNIT/ML IJ SOLN: 1000.0000 [IU] | Freq: Once | INTRAMUSCULAR | Status: DC

## 2019-01-16 MED ORDER — ACD FORMULA A 0.73-2.45-2.2 GM/100ML VI SOLN
500.0000 mL | Status: DC
  Administered 2019-01-16: 500 mL via INTRAVENOUS
  Filled 2019-01-16 (×2): qty 500

## 2019-01-16 MED ORDER — ACD FORMULA A 0.73-2.45-2.2 GM/100ML VI SOLN
Status: AC
  Filled 2019-01-16: qty 500

## 2019-01-16 MED ORDER — HEPARIN SODIUM (PORCINE) 1000 UNIT/ML IJ SOLN
INTRAMUSCULAR | Status: AC
  Filled 2019-01-16: qty 4

## 2019-01-16 MED ORDER — SODIUM CHLORIDE 0.9 % IV SOLN: Freq: Once | INTRAVENOUS | Status: DC

## 2019-01-16 MED ORDER — MIDAZOLAM HCL 2 MG/2ML IJ SOLN
INTRAMUSCULAR | Status: AC
  Filled 2019-01-16: qty 2

## 2019-01-16 MED ORDER — ACETAMINOPHEN 325 MG PO TABS
650.0000 mg | ORAL_TABLET | ORAL | Status: DC | PRN
  Administered 2019-01-16: 650 mg via ORAL

## 2019-01-16 MED ORDER — HEPARIN SODIUM (PORCINE) 1000 UNIT/ML IJ SOLN
INTRAMUSCULAR | Status: AC
  Filled 2019-01-16: qty 1

## 2019-01-16 MED ORDER — MIDAZOLAM HCL 2 MG/2ML IJ SOLN
INTRAMUSCULAR | Status: AC | PRN
  Administered 2019-01-16: 0.5 mg via INTRAVENOUS
  Administered 2019-01-16: 1 mg via INTRAVENOUS
  Administered 2019-01-16: 0.5 mg via INTRAVENOUS

## 2019-01-16 MED ORDER — ACETAMINOPHEN 325 MG PO TABS: 650.0000 mg | ORAL_TABLET | ORAL | Status: DC | PRN

## 2019-01-16 MED ORDER — CEFAZOLIN SODIUM-DEXTROSE 2-4 GM/100ML-% IV SOLN
2.0000 g | INTRAVENOUS | Status: DC
  Filled 2019-01-16: qty 100

## 2019-01-16 MED ORDER — DIPHENHYDRAMINE HCL 25 MG PO CAPS
25.0000 mg | ORAL_CAPSULE | Freq: Four times a day (QID) | ORAL | Status: DC | PRN
  Administered 2019-01-16: 25 mg via ORAL

## 2019-01-16 MED ORDER — LIDOCAINE HCL 1 % IJ SOLN
INTRAMUSCULAR | Status: AC
  Filled 2019-01-16: qty 20

## 2019-01-16 MED ORDER — SODIUM CHLORIDE 0.9 % IV SOLN
INTRAVENOUS | Status: AC
  Administered 2019-01-16 (×2): via INTRAVENOUS_CENTRAL
  Filled 2019-01-16 (×2): qty 200

## 2019-01-16 MED ORDER — CEFAZOLIN SODIUM-DEXTROSE 2-4 GM/100ML-% IV SOLN
INTRAVENOUS | Status: AC
  Filled 2019-01-16: qty 100

## 2019-01-16 MED ORDER — DIPHENHYDRAMINE HCL 25 MG PO CAPS: 25.0000 mg | ORAL_CAPSULE | Freq: Four times a day (QID) | ORAL | Status: DC | PRN

## 2019-01-16 MED ORDER — HEPARIN SODIUM (PORCINE) 1000 UNIT/ML IJ SOLN
3.2000 mL | Freq: Once | INTRAMUSCULAR | Status: AC
  Administered 2019-01-16: 3200 [IU] via INTRAVENOUS

## 2019-01-16 MED ORDER — FENTANYL CITRATE (PF) 100 MCG/2ML IJ SOLN
INTRAMUSCULAR | Status: AC
  Filled 2019-01-16: qty 2

## 2019-01-16 NOTE — Progress Notes (Signed)
Continues to have difficulty with sight, but headache is improved.   She is scheduled to have plasmapheresis starting this afternoon then QOD.   She will need to follow up with Dr. Felecia Shelling after discharge as with her MRI changes, I think MS is the likely diagnosis at this point and she will need disease modifying therapy.   She can be discharged after her first plasmapheresis.   Roland Rack, MD [facility] [phone]  If 7pm- 7am, please page neurology on call as listed in [place].

## 2019-01-16 NOTE — Progress Notes (Signed)
patient left to hemodialysis for her plasmapheresis, night shift nurse is aware that patient can be discharge home after treatment.

## 2019-01-16 NOTE — Procedures (Signed)
Interventional Radiology Procedure Note  Procedure: Right IJ tunneled pheresis/HD catheter  Complications: None  Estimated Blood Loss: < 10 mL  Findings: 19 cm tip to cuff length Palindrome catheter placed via right IJ vein. Tip in RA. OK to use.  Venetia Night. Kathlene Cote, M.D Pager:  [phone]

## 2019-01-16 NOTE — Consult Note (Signed)
Chief Complaint: Patient was seen in consultation today for tunneled plasma pheresis catheter placement Chief Complaint  Patient presents with   Headache   Blurred Vision   at the request of Dr Willeen Niece   Supervising Physician: Irish Lack  Patient Status: [facility] - In-pt  History of Present Illness: Jennifer [name] is a 28 y.o. female   Hx Rt optic neuritis 08/2017 Vision loss despite steroids and plasma exchange New Left optic neuritis--- admitted 9/25 Probable MS Demyelinating illness MR 9/25: IMPRESSION: 1. Findings consistent with acute left optic neuritis. 2. Scattered patchy T2/FLAIR hyperintensities involving the supratentorial cerebral white matter, highly suspicious for possible demyelinating disease. Overall, appearance is minimally progressed from previous, with a subcentimeter new lesion involving the left splenium. Associated mild diffusion abnormality suspicious for active demyelination.  Need for plasma pheresis To start in house and OP treatments   Past Medical History:  Diagnosis Date   Ectopic pregnancy    MTX 07/2012   Optic neuritis 08/19/2017   Right eye    Past Surgical History:  Procedure Laterality Date   IR FLUORO GUIDE CV LINE RIGHT  08/30/2017   IR REMOVAL TUN CV CATH W/O FL  09/20/2017   IR US GUIDE VASC ACCESS RIGHT  08/30/2017   NO PAST SURGERIES      Allergies: Patient has no known allergies.  Medications: Prior to Admission medications   Medication Sig Start Date End Date Taking? Authorizing Provider  acetaminophen (TYLENOL) 650 MG CR tablet Take 1,300 mg by mouth every 8 (eight) hours as needed (headaches/pain).    Yes [provider]  diphenhydrAMINE (BENADRYL) 25 MG tablet Take 25 mg by mouth daily as needed (headache).    [provider]  indomethacin (INDOCIN) 25 MG capsule Take 1 capsule (25 mg total) by mouth 2 (two) times daily with a meal. Patient not taking: Reported on 01/11/2019  09/08/17   Sater, Pearletha Furl, MD     Family History  Problem Relation Age of Onset   Cancer Maternal Grandmother    Heart disease Maternal Grandmother    Breast cancer Maternal Grandmother    Cancer Maternal Grandfather    Fibromyalgia Mother    Other Mother        concussion   Other Sister        pre-eclampsia   Lupus Sister    Kidney disease Sister    Asthma Son    Multiple sclerosis Maternal Uncle     Social History   Socioeconomic History   Marital status: Married    Spouse name: Not on file   Number of children: Not on file   Years of education: Not on file   Highest education level: Not on file  Occupational History   Not on file  Social Needs   Financial resource strain: Not on file   Food insecurity    Worry: Not on file    Inability: Not on file   Transportation needs    Medical: Not on file    Non-medical: Not on file  Tobacco Use   Smoking status: Current Every Day Smoker    Packs/day: 0.50    Years: 10.00    Pack years: 5.00    Types: Cigarettes   Smokeless tobacco: Never Used  Substance and Sexual Activity   Alcohol use: Yes    Alcohol/week: 7.0 standard drinks    Types: 4 Cans of beer, 3 Shots of liquor per week    Comment: twice a month  Drug use: Yes    Types: Marijuana    Comment: once daily   Sexual activity: Yes    Birth control/protection: None  Lifestyle   Physical activity    Days per week: Not on file    Minutes per session: Not on file   Stress: Not on file  Relationships   Social connections    Talks on phone: Not on file    Gets together: Not on file    Attends religious service: Not on file    Active member of club or organization: Not on file    Attends meetings of clubs or organizations: Not on file    Relationship status: Not on file  Other Topics Concern   Not on file  Social History Narrative   Not on file    Review of Systems: A 12 point ROS discussed and pertinent positives are  indicated in the HPI above.  All other systems are negative.  Review of Systems  Constitutional: Positive for activity change. Negative for appetite change, fatigue and fever.  Eyes: Positive for visual disturbance.  Respiratory: Negative for shortness of breath.   Cardiovascular: Negative for chest pain.  Gastrointestinal: Negative for abdominal pain.  Neurological: Negative for weakness.  Psychiatric/Behavioral: Negative for behavioral problems and confusion.    Vital Signs: BP (!) 104/55 (BP Location: Left Arm)    Pulse (!) 56    Temp 98.3 F (36.8 C) (Oral)    Resp 16    Ht 5\' 1"  (1.549 m)    Wt 135 lb (61.2 kg)    LMP 12/20/2018    SpO2 99%    BMI 25.51 kg/m   Physical Exam Vitals signs reviewed.  Neck:     Musculoskeletal: Normal range of motion.  Cardiovascular:     Rate and Rhythm: Normal rate and regular rhythm.  Pulmonary:     Effort: Pulmonary effort is normal.     Breath sounds: Normal breath sounds.  Abdominal:     General: Bowel sounds are normal.  Musculoskeletal: Normal range of motion.  Skin:    General: Skin is warm and dry.  Neurological:     Mental Status: She is alert.  Psychiatric:        Behavior: Behavior normal.     Imaging: Dg Chest 2 View  Result Date: 01/11/2019 CLINICAL DATA:  28 year old female for admission. EXAM: CHEST - 2 VIEW COMPARISON:  None. FINDINGS: The lungs are clear. There is no pleural effusion pneumothorax. The cardiac silhouette is within normal limits. No acute osseous pathology. IMPRESSION: No active cardiopulmonary disease. Electronically Signed   By: Elgie CollardArash  Radparvar M.D.   On: 01/11/2019 22:23   Ct Head Wo Contrast  Result Date: 01/11/2019 CLINICAL DATA:  Patient states she has had a headache for a week and a half. Patient lost right vision in her right eye from an "MS attack with optic neuritis". Pt states the same is happening again. She is having blurry vision in the left. Pt went to her eye MD and was told to come here  for IV IGE. EXAM: CT HEAD WITHOUT CONTRAST TECHNIQUE: Contiguous axial images were obtained from the base of the skull through the vertex without intravenous contrast. COMPARISON:  08/19/2017 FINDINGS: Brain: No evidence of acute infarction, hemorrhage, hydrocephalus, extra-axial collection or mass lesion/mass effect. Vascular: No hyperdense vessel or unexpected calcification. Skull: Normal. Negative for fracture or focal lesion. Sinuses/Orbits: Globes and orbits are unremarkable. The visualized sinuses and mastoid air cells are clear. Other:  None. IMPRESSION: Normal unenhanced CT scan of the brain. Electronically Signed   By: Lajean Manes M.D.   On: 01/11/2019 19:06   Mr Jeri Cos And Wo Contrast  Result Date: 01/12/2019 CLINICAL DATA:  Initial evaluation for possible left optic neuritis. EXAM: MRI HEAD AND ORBITS WITHOUT AND WITH CONTRAST TECHNIQUE: Multiplanar, multiecho pulse sequences of the brain and surrounding structures were obtained without and with intravenous contrast. Multiplanar, multiecho pulse sequences of the orbits and surrounding structures were obtained including fat saturation techniques, before and after intravenous contrast administration. CONTRAST:  53mL GADAVIST GADOBUTROL 1 MMOL/ML IV SOLN COMPARISON:  Previous MRI from 08/20/2017. FINDINGS: MRI HEAD FINDINGS Brain: Cerebral volume stable, and remains within normal limits for age. Again seen are a few scattered T2/FLAIR hyperintensities involving the periventricular and subcortical white matter of both cerebral hemispheres, nonspecific, but most suspicious for demyelinating disease/multiple sclerosis. Overall, appearance is minimally progressed relative to 2019 as evidence by AP probable new lesion involving the left splenium, faintly visible on axial FLAIR sequence (series 9, image 14). Associated mild diffusion abnormality suspicious for active demyelination (series 5, image 74), although no definite enhancement seen following contrast  administration. Otherwise, the few additional scattered lesions are relatively unchanged. No other evidence for active demyelination. No brainstem or infratentorial involvement. No evidence for acute infarct. Gray-white matter differentiation without evidence for chronic or interval cortical infarction. No evidence for acute or chronic intracranial hemorrhage. No mass lesion, midline shift or mass effect. No hydrocephalus. No extra-axial fluid collection. Pituitary gland within normal limits. Midline structures intact. Vascular: Major intracranial vascular flow voids are well maintained and normal in appearance. Skull and upper cervical spine: Craniocervical junction within normal limits. Bone marrow signal intensity normal. No scalp soft tissue abnormality. Other: Mastoid air cells are clear. MRI ORBITS FINDINGS Orbits: Globes are symmetric in size with normal appearance and morphology bilaterally. Abnormal edema with irregularity and enhancement seen involving the left optic nerve, consistent with acute optic neuritis (series 25, image 19). Hazy edema and enhancement seen within the adjacent intraconal fat. Involvement extends to the pre chiasmatic segment. Optic chiasm itself is relatively spared and normal in appearance. Right optic nerve relatively normal in appearance without evidence for acute optic neuritis. Remainder of the orbital structures including the extraocular muscles, lacrimal glands, and superior orbital veins are normal. Visualized sinuses: Visualized paranasal sinuses are clear. Soft tissues: Periorbital soft tissues within normal limits. Delete that IMPRESSION: 1. Findings consistent with acute left optic neuritis. 2. Scattered patchy T2/FLAIR hyperintensities involving the supratentorial cerebral white matter, highly suspicious for possible demyelinating disease. Overall, appearance is minimally progressed from previous, with a subcentimeter new lesion involving the left splenium. Associated  mild diffusion abnormality suspicious for active demyelination. Electronically Signed   By: Jeannine Boga M.D.   On: 01/12/2019 02:19   Mr Rosealee Albee BZ Contrast  Result Date: 01/12/2019 CLINICAL DATA:  Initial evaluation for possible left optic neuritis. EXAM: MRI HEAD AND ORBITS WITHOUT AND WITH CONTRAST TECHNIQUE: Multiplanar, multiecho pulse sequences of the brain and surrounding structures were obtained without and with intravenous contrast. Multiplanar, multiecho pulse sequences of the orbits and surrounding structures were obtained including fat saturation techniques, before and after intravenous contrast administration. CONTRAST:  65mL GADAVIST GADOBUTROL 1 MMOL/ML IV SOLN COMPARISON:  Previous MRI from 08/20/2017. FINDINGS: MRI HEAD FINDINGS Brain: Cerebral volume stable, and remains within normal limits for age. Again seen are a few scattered T2/FLAIR hyperintensities involving the periventricular and subcortical white matter of both cerebral hemispheres,  nonspecific, but most suspicious for demyelinating disease/multiple sclerosis. Overall, appearance is minimally progressed relative to 2019 as evidence by AP probable new lesion involving the left splenium, faintly visible on axial FLAIR sequence (series 9, image 14). Associated mild diffusion abnormality suspicious for active demyelination (series 5, image 74), although no definite enhancement seen following contrast administration. Otherwise, the few additional scattered lesions are relatively unchanged. No other evidence for active demyelination. No brainstem or infratentorial involvement. No evidence for acute infarct. Gray-white matter differentiation without evidence for chronic or interval cortical infarction. No evidence for acute or chronic intracranial hemorrhage. No mass lesion, midline shift or mass effect. No hydrocephalus. No extra-axial fluid collection. Pituitary gland within normal limits. Midline structures intact. Vascular:  Major intracranial vascular flow voids are well maintained and normal in appearance. Skull and upper cervical spine: Craniocervical junction within normal limits. Bone marrow signal intensity normal. No scalp soft tissue abnormality. Other: Mastoid air cells are clear. MRI ORBITS FINDINGS Orbits: Globes are symmetric in size with normal appearance and morphology bilaterally. Abnormal edema with irregularity and enhancement seen involving the left optic nerve, consistent with acute optic neuritis (series 25, image 19). Hazy edema and enhancement seen within the adjacent intraconal fat. Involvement extends to the pre chiasmatic segment. Optic chiasm itself is relatively spared and normal in appearance. Right optic nerve relatively normal in appearance without evidence for acute optic neuritis. Remainder of the orbital structures including the extraocular muscles, lacrimal glands, and superior orbital veins are normal. Visualized sinuses: Visualized paranasal sinuses are clear. Soft tissues: Periorbital soft tissues within normal limits. Delete that IMPRESSION: 1. Findings consistent with acute left optic neuritis. 2. Scattered patchy T2/FLAIR hyperintensities involving the supratentorial cerebral white matter, highly suspicious for possible demyelinating disease. Overall, appearance is minimally progressed from previous, with a subcentimeter new lesion involving the left splenium. Associated mild diffusion abnormality suspicious for active demyelination. Electronically Signed   By: Rise MuBenjamin  McClintock M.D.   On: 01/12/2019 02:19    Labs:  CBC: Recent Labs    01/11/19 1743 01/11/19 1744  WBC 7.3  --   HGB 14.9 16.0*  HCT 45.8 47.0*  PLT 155  --     COAGS: Recent Labs    01/11/19 1743  INR 1.0  APTT 33    BMP: Recent Labs    01/11/19 1743 01/11/19 1744  NA 135 136  K 3.5 3.5  CL 102 105  CO2 23  --   GLUCOSE 95 88  BUN 8 9  CALCIUM 9.1  --   CREATININE 0.78 0.70  GFRNONAA >60  --     GFRAA >60  --     LIVER FUNCTION TESTS: Recent Labs    01/11/19 1743  BILITOT 1.3*  AST 17  ALT 18  ALKPHOS 43  PROT 6.9  ALBUMIN 4.1    TUMOR MARKERS: No results for input(s): AFPTM, CEA, CA199, CHROMGRNA in the last 8760 hours.  Assessment and Plan:  Left optic neuritis Probable MS Demyelinating illness Need for plasma pheresis asap Scheduled for tunneled plasma pheresis catheter placement Risks and benefits discussed with the patient including, but not limited to bleeding, infection, vascular injury, pneumothorax which may require chest tube placement, air embolism or even death  All of the patient's questions were answered, patient is agreeable to proceed. Consent signed and in chart.   Thank you for this interesting consult.  I greatly enjoyed meeting [place] and look forward to participating in their care.  A copy of this  report was sent to the requesting provider on this date.  Electronically Signed: Robet Leu, PA-C 01/16/2019, 8:39 AM   I spent a total of 20 Minutes    in face to face in clinical consultation, greater than 50% of which was counseling/coordinating care for plasma pheresis catheter placement

## 2019-01-16 NOTE — Progress Notes (Signed)
Jennifer [name] to be D/C'd Home per MD order.  Discussed with the patient and all questions fully answered.  VSS, Skin clean, dry and intact without evidence of skin break down, no evidence of skin tears noted. IV catheter discontinued intact. Site without signs and symptoms of complications. Dressing and pressure applied.  An After Visit Summary was printed and given to the patient. Patient received prescription.  D/c education completed with patient/family including follow up instructions, medication list, d/c activities limitations if indicated, with other d/c instructions as indicated by MD - patient able to verbalize understanding, all questions fully answered.   Patient instructed to return to ED, call 911, or call MD for any changes in condition.   Patient escorted via [place], and D/C home via private auto.  Yvetta Coder  [facility] 01/16/2019 11:12 PM

## 2019-01-16 NOTE — Discharge Summary (Addendum)
Physician Discharge Summary  Manuella Ghazimber N [place] FAO:[id number] DOB: [birth year] DOA: 01/11/2019  PCP: Patient, No Pcp Per  Admit date: 01/11/2019 Discharge date: 01/16/2019  Admitted From: Home Disposition:  Home  Discharge Condition:Stable CODE STATUS:FULL Diet recommendation: Regular  Brief/Interim Summary: 28 y.o.femalewith medical history significant ofoptic neuritis of the right eye with complete visual loss despite receiving IV steroids and plasma exchange in May 2019, with concern for multiple sclerosis and incomplete work-up due to loss of follow-up for insurance reasons presenting to the hospital for evaluation of headache and blurry vision in the left eye.  She was seen by our ophthalmologist, Dr. Sherryll BurgerShah and was sent to the emergency department for concern for optic nerve edema. MRI finding showed acute left optic neuritis,scattered patchy T2/FLAIR hyperintensities involving the supratentorial cerebral white matter, highly suspicious for possible demyelinating disease. Also showed a subcentimeter new lesion involving the left Splenium, suspicious for active demyelination.  Patient has been admitted for management with IV steroid for 5 days.  Neurology was following.  She completed 5 days of IV methylprednisolone 1 g.  Her left eye blurry vision has not significantly improved.  Neurology decided to start on plasmapheresis.  She will get tunneled catheter placement today and will get first session of plasmapheresis.  She will be discharged after this first session and she will be called by neurology for the remaining sessions.  Neurology planning for total of 5 sessions of plasmapheresis. Patient is medically  stable for discharge home after first session of plasmapheresis today.  Following problems were addressed during hospitalization:  Left optic neuritis/concern for MS: Presented with headache, 3 days  history of blurry vision on the left eye.  MRI finding as above.  Started on IV steroid  and she completed 5 days course.   Her left eye blurry vision has not significantly improved.  Neurology decided to start on plasmapheresis.  She will get tunneled catheter placement today and will get first session of plasmapheresis.  She will be discharged after this first session and she will be called by neurology for the remaining sessions.  Neurology planning for total of 5 sessions of plasmapheresis.  Tobacco use: Counseled for cessation continue nicotine patch   Discharge Diagnoses:  Principal Problem:   Optic neuritis, left Active Problems:   Encounter for screening for HIV   Tobacco abuse    Discharge Instructions  Discharge Instructions    Diet - low sodium heart healthy   Complete by: As directed    Discharge instructions   Complete by: As directed    1)You will be called by neurology for further plasmapheresis sessions . 2)Please follow up with your ophthalmologist as an outpatient.   Increase activity slowly   Complete by: As directed      Allergies as of 01/16/2019   No Known Allergies     Medication List    STOP taking these medications   diphenhydrAMINE 25 MG tablet Commonly known as: BENADRYL   indomethacin 25 MG capsule Commonly known as: INDOCIN     TAKE these medications   acetaminophen 650 MG CR tablet Commonly known as: TYLENOL Take 1,300 mg by mouth every 8 (eight) hours as needed (headaches/pain).      Follow-up Information    [facility]. Schedule an appointment as soon as possible for a visit in 4 week(s).   Specialty: Neurology Contact information: [address] [place] [id number] [phone]         No Known Allergies  Consultations:  Neurology   Procedures/Studies: Dg Chest 2 View  Result Date: 01/11/2019 CLINICAL DATA:  28 year old female for admission. EXAM: CHEST - 2 VIEW COMPARISON:  None. FINDINGS: The lungs are clear. There is no pleural effusion pneumothorax. The  cardiac silhouette is within normal limits. No acute osseous pathology. IMPRESSION: No active cardiopulmonary disease. Electronically Signed   By: Elgie Collard M.D.   On: 01/11/2019 22:23   Ct Head Wo Contrast  Result Date: 01/11/2019 CLINICAL DATA:  Patient states she has had a headache for a week and a half. Patient lost right vision in her right eye from an "MS attack with optic neuritis". Pt states the same is happening again. She is having blurry vision in the left. Pt went to her eye MD and was told to come here for IV IGE. EXAM: CT HEAD WITHOUT CONTRAST TECHNIQUE: Contiguous axial images were obtained from the base of the skull through the vertex without intravenous contrast. COMPARISON:  08/19/2017 FINDINGS: Brain: No evidence of acute infarction, hemorrhage, hydrocephalus, extra-axial collection or mass lesion/mass effect. Vascular: No hyperdense vessel or unexpected calcification. Skull: Normal. Negative for fracture or focal lesion. Sinuses/Orbits: Globes and orbits are unremarkable. The visualized sinuses and mastoid air cells are clear. Other: None. IMPRESSION: Normal unenhanced CT scan of the brain. Electronically Signed   By: Amie Portland M.D.   On: 01/11/2019 19:06   Mr Laqueta Jean And Wo Contrast  Result Date: 01/12/2019 CLINICAL DATA:  Initial evaluation for possible left optic neuritis. EXAM: MRI HEAD AND ORBITS WITHOUT AND WITH CONTRAST TECHNIQUE: Multiplanar, multiecho pulse sequences of the brain and surrounding structures were obtained without and with intravenous contrast. Multiplanar, multiecho pulse sequences of the orbits and surrounding structures were obtained including fat saturation techniques, before and after intravenous contrast administration. CONTRAST:  6mL GADAVIST GADOBUTROL 1 MMOL/ML IV SOLN COMPARISON:  Previous MRI from 08/20/2017. FINDINGS: MRI HEAD FINDINGS Brain: Cerebral volume stable, and remains within normal limits for age. Again seen are a few scattered  T2/FLAIR hyperintensities involving the periventricular and subcortical white matter of both cerebral hemispheres, nonspecific, but most suspicious for demyelinating disease/multiple sclerosis. Overall, appearance is minimally progressed relative to 2019 as evidence by AP probable new lesion involving the left splenium, faintly visible on axial FLAIR sequence (series 9, image 14). Associated mild diffusion abnormality suspicious for active demyelination (series 5, image 74), although no definite enhancement seen following contrast administration. Otherwise, the few additional scattered lesions are relatively unchanged. No other evidence for active demyelination. No brainstem or infratentorial involvement. No evidence for acute infarct. Gray-white matter differentiation without evidence for chronic or interval cortical infarction. No evidence for acute or chronic intracranial hemorrhage. No mass lesion, midline shift or mass effect. No hydrocephalus. No extra-axial fluid collection. Pituitary gland within normal limits. Midline structures intact. Vascular: Major intracranial vascular flow voids are well maintained and normal in appearance. Skull and upper cervical spine: Craniocervical junction within normal limits. Bone marrow signal intensity normal. No scalp soft tissue abnormality. Other: Mastoid air cells are clear. MRI ORBITS FINDINGS Orbits: Globes are symmetric in size with normal appearance and morphology bilaterally. Abnormal edema with irregularity and enhancement seen involving the left optic nerve, consistent with acute optic neuritis (series 25, image 19). Hazy edema and enhancement seen within the adjacent intraconal fat. Involvement extends to the pre chiasmatic segment. Optic chiasm itself is relatively spared and normal in appearance. Right optic nerve relatively normal in appearance without evidence for acute optic neuritis. Remainder of the orbital  structures including the extraocular muscles,  lacrimal glands, and superior orbital veins are normal. Visualized sinuses: Visualized paranasal sinuses are clear. Soft tissues: Periorbital soft tissues within normal limits. Delete that IMPRESSION: 1. Findings consistent with acute left optic neuritis. 2. Scattered patchy T2/FLAIR hyperintensities involving the supratentorial cerebral white matter, highly suspicious for possible demyelinating disease. Overall, appearance is minimally progressed from previous, with a subcentimeter new lesion involving the left splenium. Associated mild diffusion abnormality suspicious for active demyelination. Electronically Signed   By: Rise MuBenjamin  McClintock M.D.   On: 01/12/2019 02:19   Mr Rockwell GermanyOrbits W ZOWo Contrast  Result Date: 01/12/2019 CLINICAL DATA:  Initial evaluation for possible left optic neuritis. EXAM: MRI HEAD AND ORBITS WITHOUT AND WITH CONTRAST TECHNIQUE: Multiplanar, multiecho pulse sequences of the brain and surrounding structures were obtained without and with intravenous contrast. Multiplanar, multiecho pulse sequences of the orbits and surrounding structures were obtained including fat saturation techniques, before and after intravenous contrast administration. CONTRAST:  6mL GADAVIST GADOBUTROL 1 MMOL/ML IV SOLN COMPARISON:  Previous MRI from 08/20/2017. FINDINGS: MRI HEAD FINDINGS Brain: Cerebral volume stable, and remains within normal limits for age. Again seen are a few scattered T2/FLAIR hyperintensities involving the periventricular and subcortical white matter of both cerebral hemispheres, nonspecific, but most suspicious for demyelinating disease/multiple sclerosis. Overall, appearance is minimally progressed relative to 2019 as evidence by AP probable new lesion involving the left splenium, faintly visible on axial FLAIR sequence (series 9, image 14). Associated mild diffusion abnormality suspicious for active demyelination (series 5, image 74), although no definite enhancement seen following contrast  administration. Otherwise, the few additional scattered lesions are relatively unchanged. No other evidence for active demyelination. No brainstem or infratentorial involvement. No evidence for acute infarct. Gray-white matter differentiation without evidence for chronic or interval cortical infarction. No evidence for acute or chronic intracranial hemorrhage. No mass lesion, midline shift or mass effect. No hydrocephalus. No extra-axial fluid collection. Pituitary gland within normal limits. Midline structures intact. Vascular: Major intracranial vascular flow voids are well maintained and normal in appearance. Skull and upper cervical spine: Craniocervical junction within normal limits. Bone marrow signal intensity normal. No scalp soft tissue abnormality. Other: Mastoid air cells are clear. MRI ORBITS FINDINGS Orbits: Globes are symmetric in size with normal appearance and morphology bilaterally. Abnormal edema with irregularity and enhancement seen involving the left optic nerve, consistent with acute optic neuritis (series 25, image 19). Hazy edema and enhancement seen within the adjacent intraconal fat. Involvement extends to the pre chiasmatic segment. Optic chiasm itself is relatively spared and normal in appearance. Right optic nerve relatively normal in appearance without evidence for acute optic neuritis. Remainder of the orbital structures including the extraocular muscles, lacrimal glands, and superior orbital veins are normal. Visualized sinuses: Visualized paranasal sinuses are clear. Soft tissues: Periorbital soft tissues within normal limits. Delete that IMPRESSION: 1. Findings consistent with acute left optic neuritis. 2. Scattered patchy T2/FLAIR hyperintensities involving the supratentorial cerebral white matter, highly suspicious for possible demyelinating disease. Overall, appearance is minimally progressed from previous, with a subcentimeter new lesion involving the left splenium. Associated  mild diffusion abnormality suspicious for active demyelination. Electronically Signed   By: Rise MuBenjamin  McClintock M.D.   On: 01/12/2019 02:19      Subjective:  Patient seen and examined the bedside this morning.  Hemodynamically stable for discharge.  Patient verbalized understanding of further plan.  Discharge Exam: Vitals:   01/16/19 1600 01/16/19 1610  BP: 112/66 108/78  Pulse: (!) 54 60  Resp:  18 20  Temp:    SpO2: 98% 96%   Vitals:   01/16/19 1550 01/16/19 1555 01/16/19 1600 01/16/19 1610  BP: 111/70 107/68 112/66 108/78  Pulse: (!) 53 (!) 53 (!) 54 60  Resp: 18 (!) 21 18 20   Temp:      TempSrc:      SpO2: 98% 98% 98% 96%  Weight:      Height:        General: Pt is alert, awake, not in acute distress Cardiovascular: RRR, S1/S2 +, no rubs, no gallops Respiratory: CTA bilaterally, no wheezing, no rhonchi Abdominal: Soft, NT, ND, bowel sounds + Extremities: no edema, no cyanosis    The results of significant diagnostics from this hospitalization (including imaging, microbiology, ancillary and laboratory) are listed below for reference.     Microbiology: Recent Results (from the past 240 hour(s))  SARS Coronavirus 2 [facility] order, Performed in [facility] hospital lab) Nasopharyngeal Nasopharyngeal Swab     Status: None   Collection Time: 01/11/19 10:51 PM   Specimen: Nasopharyngeal Swab  Result Value Ref Range Status   SARS Coronavirus 2 NEGATIVE NEGATIVE Final    Comment: (NOTE) If result is NEGATIVE SARS-CoV-2 target nucleic acids are NOT DETECTED. The SARS-CoV-2 RNA is generally detectable in upper and lower  respiratory specimens during the acute phase of infection. The lowest  concentration of SARS-CoV-2 viral copies this assay can detect is 250  copies / mL. A negative result does not preclude SARS-CoV-2 infection  and should not be used as the sole basis for treatment or other  patient management decisions.  A negative result may occur with  improper  specimen collection / handling, submission of specimen other  than nasopharyngeal swab, presence of viral mutation(s) within the  areas targeted by this assay, and inadequate number of viral copies  (<250 copies / mL). A negative result must be combined with clinical  observations, patient history, and epidemiological information. If result is POSITIVE SARS-CoV-2 target nucleic acids are DETECTED. The SARS-CoV-2 RNA is generally detectable in upper and lower  respiratory specimens dur ing the acute phase of infection.  Positive  results are indicative of active infection with SARS-CoV-2.  Clinical  correlation with patient history and other diagnostic information is  necessary to determine patient infection status.  Positive results do  not rule out bacterial infection or co-infection with other viruses. If result is PRESUMPTIVE POSTIVE SARS-CoV-2 nucleic acids MAY BE PRESENT.   A presumptive positive result was obtained on the submitted specimen  and confirmed on repeat testing.  While 2019 novel coronavirus  (SARS-CoV-2) nucleic acids may be present in the submitted sample  additional confirmatory testing may be necessary for epidemiological  and / or clinical management purposes  to differentiate between  SARS-CoV-2 and other Sarbecovirus currently known to infect humans.  If clinically indicated additional testing with an alternate test  methodology [phone]) is advised. The SARS-CoV-2 RNA is generally  detectable in upper and lower respiratory sp ecimens during the acute  phase of infection. The expected result is Negative. Fact Sheet for Patients:  (UYQ0347 Fact Sheet for Healthcare Providers: BoilerBrush.com.cy This test is not yet approved or cleared by the https://pope.com/ FDA and has been authorized for detection and/or diagnosis of SARS-CoV-2 by FDA under an Emergency Use Authorization (EUA).  This EUA will remain in  effect (meaning this test can be used) for the duration of the COVID-19 declaration under Section 564(b)(1) of the Act, 21 U.S.C. section 360bbb-3(b)(1), unless  the authorization is terminated or revoked sooner. Performed at [facility], [address] [place], [place] [address]      Labs: BNP (last 3 results) No results for input(s): BNP in the last 8760 hours. Basic Metabolic Panel: Recent Labs  Lab 01/11/19 1743 01/11/19 1744  NA 135 136  K 3.5 3.5  CL 102 105  CO2 23  --   GLUCOSE 95 88  BUN 8 9  CREATININE 0.78 0.70  CALCIUM 9.1  --    Liver Function Tests: Recent Labs  Lab 01/11/19 1743  AST 17  ALT 18  ALKPHOS 43  BILITOT 1.3*  PROT 6.9  ALBUMIN 4.1   No results for input(s): LIPASE, AMYLASE in the last 168 hours. No results for input(s): AMMONIA in the last 168 hours. CBC: Recent Labs  Lab 01/11/19 1743 01/11/19 1744  WBC 7.3  --   NEUTROABS 3.5  --   HGB 14.9 16.0*  HCT 45.8 47.0*  MCV 86.4  --   PLT 155  --    Cardiac Enzymes: No results for input(s): CKTOTAL, CKMB, CKMBINDEX, TROPONINI in the last 168 hours. BNP: Invalid input(s): POCBNP CBG: No results for input(s): GLUCAP in the last 168 hours. D-Dimer No results for input(s): DDIMER in the last 72 hours. Hgb A1c No results for input(s): HGBA1C in the last 72 hours. Lipid Profile No results for input(s): CHOL, HDL, LDLCALC, TRIG, CHOLHDL, LDLDIRECT in the last 72 hours. Thyroid function studies No results for input(s): TSH, T4TOTAL, T3FREE, THYROIDAB in the last 72 hours.  Invalid input(s): FREET3 Anemia work up No results for input(s): VITAMINB12, FOLATE, FERRITIN, TIBC, IRON, RETICCTPCT in the last 72 hours. Urinalysis    Component Value Date/Time   COLORURINE YELLOW 01/12/2019 1510   APPEARANCEUR HAZY (A) 01/12/2019 1510   LABSPEC 1.033 (H) 01/12/2019 1510   PHURINE 5.0 01/12/2019 1510   GLUCOSEU NEGATIVE 01/12/2019 1510   HGBUR NEGATIVE 01/12/2019 1510    BILIRUBINUR NEGATIVE 01/12/2019 1510   KETONESUR 20 (A) 01/12/2019 1510   PROTEINUR 30 (A) 01/12/2019 1510   UROBILINOGEN 0.2 08/04/2012 0750   NITRITE NEGATIVE 01/12/2019 1510   LEUKOCYTESUR NEGATIVE 01/12/2019 1510   Sepsis Labs Invalid input(s): PROCALCITONIN,  WBC,  LACTICIDVEN Microbiology Recent Results (from the past 240 hour(s))  SARS Coronavirus 2 [facility] order, Performed in [facility] hospital lab) Nasopharyngeal Nasopharyngeal Swab     Status: None   Collection Time: 01/11/19 10:51 PM   Specimen: Nasopharyngeal Swab  Result Value Ref Range Status   SARS Coronavirus 2 NEGATIVE NEGATIVE Final    Comment: (NOTE) If result is NEGATIVE SARS-CoV-2 target nucleic acids are NOT DETECTED. The SARS-CoV-2 RNA is generally detectable in upper and lower  respiratory specimens during the acute phase of infection. The lowest  concentration of SARS-CoV-2 viral copies this assay can detect is 250  copies / mL. A negative result does not preclude SARS-CoV-2 infection  and should not be used as the sole basis for treatment or other  patient management decisions.  A negative result may occur with  improper specimen collection / handling, submission of specimen other  than nasopharyngeal swab, presence of viral mutation(s) within the  areas targeted by this assay, and inadequate number of viral copies  (<250 copies / mL). A negative result must be combined with clinical  observations, patient history, and epidemiological information. If result is POSITIVE SARS-CoV-2 target nucleic acids are DETECTED. The SARS-CoV-2 RNA is generally detectable in upper and lower  respiratory  specimens dur ing the acute phase of infection.  Positive  results are indicative of active infection with SARS-CoV-2.  Clinical  correlation with patient history and other diagnostic information is  necessary to determine patient infection status.  Positive results do  not rule out bacterial infection or  co-infection with other viruses. If result is PRESUMPTIVE POSTIVE SARS-CoV-2 nucleic acids MAY BE PRESENT.   A presumptive positive result was obtained on the submitted specimen  and confirmed on repeat testing.  While 2019 novel coronavirus  (SARS-CoV-2) nucleic acids may be present in the submitted sample  additional confirmatory testing may be necessary for epidemiological  and / or clinical management purposes  to differentiate between  SARS-CoV-2 and other Sarbecovirus currently known to infect humans.  If clinically indicated additional testing with an alternate test  methodology [phone]) is advised. The SARS-CoV-2 RNA is generally  detectable in upper and lower respiratory sp ecimens during the acute  phase of infection. The expected result is Negative. Fact Sheet for Patients:  StrictlyIdeas.no Fact Sheet for Healthcare Providers: BankingDealers.co.za This test is not yet approved or cleared by the [place] FDA and has been authorized for detection and/or diagnosis of SARS-CoV-2 by FDA under an Emergency Use Authorization (EUA).  This EUA will remain in effect (meaning this test can be used) for the duration of the COVID-19 declaration under Section 564(b)(1) of the Act, 21 U.S.C. section 360bbb-3(b)(1), unless the authorization is terminated or revoked sooner. Performed at [facility], [place] [address], [place], [place] [address]     Please note: You were cared for by a hospitalist during your hospital stay. Once you are discharged, your primary care physician will handle any further medical issues. Please note that NO REFILLS for any discharge medications will be authorized once you are discharged, as it is imperative that you return to your primary care physician (or establish a relationship with a primary care physician if you do not have one) for your post hospital discharge needs so that they can reassess your need  for medications and monitor your lab values.    Time coordinating discharge: 40 minutes  SIGNED:   Shelly Coss, MD  [facility] 01/16/2019, 4:19 PM Pager [phone]  If 7PM-7AM, please contact night-coverage [email or web address] Password [email or web address]

## 2019-01-18 ENCOUNTER — Non-Acute Institutional Stay (HOSPITAL_COMMUNITY)
Admission: AD | Admit: 2019-01-18 | Discharge: 2019-01-18 | Disposition: A | Discharge status: Home / Self Care | Payer: Medicaid Other | Source: Ambulatory Visit | Attending: Radiology | Admitting: Radiology

## 2019-01-18 DIAGNOSIS — H469 Unspecified optic neuritis: Secondary | ICD-10-CM | POA: Diagnosis not present

## 2019-01-18 LAB — BASIC METABOLIC PANEL
Anion gap: 10 (ref 5–15)
BUN: 9 mg/dL (ref 6–20)
CO2: 23 mmol/L (ref 22–32)
Calcium: 8.4 mg/dL — ABNORMAL LOW (ref 8.9–10.3)
Chloride: 103 mmol/L (ref 98–111)
Creatinine, Ser: 0.54 mg/dL (ref 0.44–1.00)
GFR calc Af Amer: >60 mL/min (ref >60)
GFR calc non Af Amer: >60 mL/min (ref >60)
Glucose, Bld: 84 mg/dL (ref 70–99)
Potassium: 3.4 mmol/L — ABNORMAL LOW (ref 3.5–5.1)
Sodium: 136 mmol/L (ref 135–145)

## 2019-01-18 LAB — POCT I-STAT, CHEM 8
BUN: 9 mg/dL (ref 6–20)
Calcium, Ion: 1.18 mmol/L (ref 1.15–1.40)
Chloride: 99 mmol/L (ref 98–111)
Creatinine, Ser: 0.5 mg/dL (ref 0.44–1.00)
Glucose, Bld: 84 mg/dL (ref 70–99)
HCT: 44 % (ref 36.0–46.0)
Hemoglobin: 15 g/dL (ref 12.0–15.0)
Potassium: 3.3 mmol/L — ABNORMAL LOW (ref 3.5–5.1)
Sodium: 138 mmol/L (ref 135–145)
TCO2: 23 mmol/L (ref 22–32)

## 2019-01-18 MED ORDER — ACD FORMULA A 0.73-2.45-2.2 GM/100ML VI SOLN
Status: AC
  Administered 2019-01-18: 500 mL via INTRAVENOUS
  Filled 2019-01-18: qty 500

## 2019-01-18 MED ORDER — SODIUM CHLORIDE 0.9 % IV SOLN
INTRAVENOUS | Status: AC
  Administered 2019-01-18 (×2): via INTRAVENOUS_CENTRAL
  Filled 2019-01-18 (×2): qty 200

## 2019-01-18 MED ORDER — HEPARIN SODIUM (PORCINE) 1000 UNIT/ML IJ SOLN
1000.0000 [IU] | Freq: Once | INTRAMUSCULAR | Status: AC
  Administered 2019-01-18: 10:00:00 3200 [IU]

## 2019-01-18 MED ORDER — HEPARIN SODIUM (PORCINE) 1000 UNIT/ML IJ SOLN
INTRAMUSCULAR | Status: AC
  Administered 2019-01-18: 3200 [IU]
  Filled 2019-01-18: qty 4

## 2019-01-18 MED ORDER — ACETAMINOPHEN 325 MG PO TABS: 650.0000 mg | ORAL_TABLET | ORAL | Status: DC | PRN

## 2019-01-18 MED ORDER — ACD FORMULA A 0.73-2.45-2.2 GM/100ML VI SOLN
500.0000 mL | Status: DC
  Administered 2019-01-18: 10:00:00 500 mL via INTRAVENOUS

## 2019-01-18 MED ORDER — CALCIUM CARBONATE ANTACID 500 MG PO CHEW
2.0000 | CHEWABLE_TABLET | ORAL | Status: DC
  Administered 2019-01-18: 08:00:00 400 mg via ORAL

## 2019-01-18 MED ORDER — DIPHENHYDRAMINE HCL 25 MG PO CAPS: 25.0000 mg | ORAL_CAPSULE | Freq: Four times a day (QID) | ORAL | Status: DC | PRN

## 2019-01-18 MED ORDER — CALCIUM CARBONATE ANTACID 500 MG PO CHEW
CHEWABLE_TABLET | ORAL | Status: AC
  Administered 2019-01-18: 400 mg via ORAL
  Filled 2019-01-18: qty 4

## 2019-01-18 MED ORDER — SODIUM CHLORIDE 0.9 % IV SOLN
2.0000 g | Freq: Once | INTRAVENOUS | Status: AC
  Administered 2019-01-18: 2 g via INTRAVENOUS
  Filled 2019-01-18: qty 20

## 2019-01-18 NOTE — Progress Notes (Signed)
Tx completed; Pt tolerated tx well, denies pain/ dizziness, parethesia, n/v  or weakness. Pt accompanied to the main enratnce on a wheelchair by Bevelyn Ngo, HD Tech.

## 2019-01-20 ENCOUNTER — Ambulatory Visit (HOSPITAL_COMMUNITY)
Admission: RE | Admit: 2019-01-20 | Discharge: 2019-01-20 | Disposition: A | Discharge status: Home / Self Care | Payer: Medicaid Other | Attending: Neurology | Admitting: Neurology

## 2019-01-20 DIAGNOSIS — H469 Unspecified optic neuritis: Secondary | ICD-10-CM | POA: Insufficient documentation

## 2019-01-20 LAB — BASIC METABOLIC PANEL
Anion gap: 9 (ref 5–15)
BUN: 13 mg/dL (ref 6–20)
CO2: 25 mmol/L (ref 22–32)
Calcium: 9 mg/dL (ref 8.9–10.3)
Chloride: 102 mmol/L (ref 98–111)
Creatinine, Ser: 0.73 mg/dL (ref 0.44–1.00)
GFR calc Af Amer: >60 mL/min (ref >60)
GFR calc non Af Amer: >60 mL/min (ref >60)
Glucose, Bld: 118 mg/dL — ABNORMAL HIGH (ref 70–99)
Potassium: 4.4 mmol/L (ref 3.5–5.1)
Sodium: 136 mmol/L (ref 135–145)

## 2019-01-20 LAB — POCT I-STAT, CHEM 8
BUN: 14 mg/dL (ref 6–20)
Calcium, Ion: 1.22 mmol/L (ref 1.15–1.40)
Chloride: 99 mmol/L (ref 98–111)
Creatinine, Ser: 0.6 mg/dL (ref 0.44–1.00)
Glucose, Bld: 115 mg/dL — ABNORMAL HIGH (ref 70–99)
HCT: 48 % — ABNORMAL HIGH (ref 36.0–46.0)
Hemoglobin: 16.3 g/dL — ABNORMAL HIGH (ref 12.0–15.0)
Potassium: 3.9 mmol/L (ref 3.5–5.1)
Sodium: 137 mmol/L (ref 135–145)
TCO2: 25 mmol/L (ref 22–32)

## 2019-01-20 MED ORDER — ACETAMINOPHEN 325 MG PO TABS
650.0000 mg | ORAL_TABLET | ORAL | Status: DC | PRN
  Administered 2019-01-20: 650 mg via ORAL

## 2019-01-20 MED ORDER — SODIUM CHLORIDE 0.9 % IV SOLN
INTRAVENOUS | Status: DC
  Administered 2019-01-20 (×2): via INTRAVENOUS_CENTRAL
  Filled 2019-01-20 (×3): qty 200

## 2019-01-20 MED ORDER — ACD FORMULA A 0.73-2.45-2.2 GM/100ML VI SOLN
500.0000 mL | Status: DC
  Administered 2019-01-20: 500 mL via INTRAVENOUS

## 2019-01-20 MED ORDER — HEPARIN SODIUM (PORCINE) 1000 UNIT/ML IJ SOLN
INTRAMUSCULAR | Status: AC
  Filled 2019-01-20: qty 4

## 2019-01-20 MED ORDER — CALCIUM CARBONATE ANTACID 500 MG PO CHEW
CHEWABLE_TABLET | ORAL | Status: AC
  Filled 2019-01-20: qty 2

## 2019-01-20 MED ORDER — HEPARIN SODIUM (PORCINE) 1000 UNIT/ML IJ SOLN
1000.0000 [IU] | Freq: Once | INTRAMUSCULAR | Status: AC
  Administered 2019-01-20: 1000 [IU]

## 2019-01-20 MED ORDER — DIPHENHYDRAMINE HCL 25 MG PO CAPS
ORAL_CAPSULE | ORAL | Status: AC
  Filled 2019-01-20: qty 1

## 2019-01-20 MED ORDER — DIPHENHYDRAMINE HCL 25 MG PO CAPS
25.0000 mg | ORAL_CAPSULE | Freq: Four times a day (QID) | ORAL | Status: DC | PRN
  Administered 2019-01-20: 25 mg via ORAL

## 2019-01-20 MED ORDER — ACD FORMULA A 0.73-2.45-2.2 GM/100ML VI SOLN
Status: AC
  Filled 2019-01-20: qty 500

## 2019-01-20 MED ORDER — ACETAMINOPHEN 325 MG PO TABS
ORAL_TABLET | ORAL | Status: AC
  Filled 2019-01-20: qty 2

## 2019-01-20 MED ORDER — SODIUM CHLORIDE 0.9 % IV SOLN
2.0000 g | Freq: Once | INTRAVENOUS | Status: AC
  Administered 2019-01-20: 2 g via INTRAVENOUS
  Filled 2019-01-20: qty 20

## 2019-01-20 MED ORDER — CALCIUM CARBONATE ANTACID 500 MG PO CHEW
2.0000 | CHEWABLE_TABLET | ORAL | Status: AC
  Administered 2019-01-20 (×2): 400 mg via ORAL

## 2019-01-20 NOTE — Progress Notes (Signed)
TPE completed with no adverse events noted. Pt alert/oriented in no acute distress. No complaints voiced. Denies any pain/discomfort at this time. . This Probation officer accompanied pt in w/c to her awaiting spouse in the main admitting entrance.

## 2019-01-22 ENCOUNTER — Non-Acute Institutional Stay (HOSPITAL_COMMUNITY)
Admission: RE | Admit: 2019-01-22 | Discharge: 2019-01-22 | Disposition: A | Discharge status: Home / Self Care | Payer: Medicaid Other | Source: Ambulatory Visit | Attending: Student in an Organized Health Care Education/Training Program | Admitting: Student in an Organized Health Care Education/Training Program

## 2019-01-22 DIAGNOSIS — H469 Unspecified optic neuritis: Secondary | ICD-10-CM | POA: Insufficient documentation

## 2019-01-22 LAB — CBC
HCT: 42.4 % (ref 36.0–46.0)
Hemoglobin: 14.1 g/dL (ref 12.0–15.0)
MCH: 28.4 pg (ref 26.0–34.0)
MCHC: 33.3 g/dL (ref 30.0–36.0)
MCV: 85.3 fL (ref 80.0–100.0)
Platelets: 68 10*3/uL — ABNORMAL LOW (ref 150–400)
RBC: 4.97 MIL/uL (ref 3.87–5.11)
RDW: 13.5 % (ref 11.5–15.5)
WBC: 8.4 10*3/uL (ref 4.0–10.5)
nRBC: 0 % (ref 0.0–0.2)

## 2019-01-22 LAB — BASIC METABOLIC PANEL
Anion gap: 7 (ref 5–15)
BUN: 11 mg/dL (ref 6–20)
CO2: 25 mmol/L (ref 22–32)
Calcium: 8.8 mg/dL — ABNORMAL LOW (ref 8.9–10.3)
Chloride: 107 mmol/L (ref 98–111)
Creatinine, Ser: 0.78 mg/dL (ref 0.44–1.00)
GFR calc Af Amer: >60 mL/min (ref >60)
GFR calc non Af Amer: >60 mL/min (ref >60)
Glucose, Bld: 126 mg/dL — ABNORMAL HIGH (ref 70–99)
Potassium: 4.1 mmol/L (ref 3.5–5.1)
Sodium: 139 mmol/L (ref 135–145)

## 2019-01-22 MED ORDER — HEPARIN SODIUM (PORCINE) 1000 UNIT/ML IJ SOLN
INTRAMUSCULAR | Status: AC
  Filled 2019-01-22: qty 4

## 2019-01-22 MED ORDER — CALCIUM CARBONATE ANTACID 500 MG PO CHEW
2.0000 | CHEWABLE_TABLET | ORAL | Status: DC
  Administered 2019-01-22: 400 mg via ORAL

## 2019-01-22 MED ORDER — ACD FORMULA A 0.73-2.45-2.2 GM/100ML VI SOLN: 500.0000 mL | Status: DC

## 2019-01-22 MED ORDER — DIPHENHYDRAMINE HCL 25 MG PO CAPS
ORAL_CAPSULE | ORAL | Status: AC
  Filled 2019-01-22: qty 1

## 2019-01-22 MED ORDER — SODIUM CHLORIDE 0.9 % IV SOLN
INTRAVENOUS | Status: AC
  Administered 2019-01-22 (×2): via INTRAVENOUS_CENTRAL
  Filled 2019-01-22 (×3): qty 200

## 2019-01-22 MED ORDER — ACD FORMULA A 0.73-2.45-2.2 GM/100ML VI SOLN
Status: AC
  Filled 2019-01-22: qty 500

## 2019-01-22 MED ORDER — SODIUM CHLORIDE 0.9 % IV SOLN
2.0000 g | Freq: Once | INTRAVENOUS | Status: AC
  Administered 2019-01-22: 2 g via INTRAVENOUS
  Filled 2019-01-22: qty 20

## 2019-01-22 MED ORDER — HEPARIN SODIUM (PORCINE) 1000 UNIT/ML IJ SOLN
1000.0000 [IU] | Freq: Once | INTRAMUSCULAR | Status: AC
  Administered 2019-01-22: 1000 [IU]

## 2019-01-22 MED ORDER — DIPHENHYDRAMINE HCL 25 MG PO CAPS
25.0000 mg | ORAL_CAPSULE | Freq: Four times a day (QID) | ORAL | Status: DC | PRN
  Administered 2019-01-22: 25 mg via ORAL

## 2019-01-22 MED ORDER — ACETAMINOPHEN 325 MG PO TABS
650.0000 mg | ORAL_TABLET | ORAL | Status: DC | PRN
  Administered 2019-01-22: 650 mg via ORAL

## 2019-01-22 MED ORDER — ACETAMINOPHEN ER 650 MG PO TBCR: 1300.0000 mg | EXTENDED_RELEASE_TABLET | Freq: Three times a day (TID) | ORAL | 0 refills | Status: DC | PRN

## 2019-01-22 MED ORDER — CALCIUM CARBONATE ANTACID 500 MG PO CHEW
CHEWABLE_TABLET | ORAL | Status: AC
  Filled 2019-01-22: qty 2

## 2019-01-22 MED ORDER — ACETAMINOPHEN 325 MG PO TABS
ORAL_TABLET | ORAL | Status: AC
  Filled 2019-01-22: qty 2

## 2019-01-22 NOTE — Progress Notes (Signed)
Placed in a request for plasma exchange catheter removal after the last round of plasma exchange on 10/7.  IR made aware.  They will coordinate.  -- Amie Portland, MD [facility] Pager: [phone] If 7pm to 7am, please call on call as listed on AMION.

## 2019-01-22 NOTE — Progress Notes (Signed)
Pt tolerated TPE treatment well and no complications. Pt sent to the main entrance on a wheelchair by [employer].

## 2019-01-24 ENCOUNTER — Non-Acute Institutional Stay (HOSPITAL_COMMUNITY)
Admission: RE | Admit: 2019-01-24 | Discharge: 2019-01-24 | Disposition: A | Discharge status: Home / Self Care | Payer: Medicaid Other | Source: Ambulatory Visit | Attending: Neurology | Admitting: Neurology

## 2019-01-24 DIAGNOSIS — H469 Unspecified optic neuritis: Secondary | ICD-10-CM | POA: Diagnosis not present

## 2019-01-24 LAB — BASIC METABOLIC PANEL
Anion gap: 8 (ref 5–15)
BUN: 10 mg/dL (ref 6–20)
CO2: 25 mmol/L (ref 22–32)
Calcium: 8.9 mg/dL (ref 8.9–10.3)
Chloride: 107 mmol/L (ref 98–111)
Creatinine, Ser: 0.64 mg/dL (ref 0.44–1.00)
GFR calc Af Amer: >60 mL/min (ref >60)
GFR calc non Af Amer: >60 mL/min (ref >60)
Glucose, Bld: 96 mg/dL (ref 70–99)
Potassium: 4.3 mmol/L (ref 3.5–5.1)
Sodium: 140 mmol/L (ref 135–145)

## 2019-01-24 LAB — POCT I-STAT, CHEM 8
BUN: 12 mg/dL (ref 6–20)
Calcium, Ion: 1.28 mmol/L (ref 1.15–1.40)
Chloride: 103 mmol/L (ref 98–111)
Creatinine, Ser: 0.6 mg/dL (ref 0.44–1.00)
Glucose, Bld: 94 mg/dL (ref 70–99)
HCT: 38 % (ref 36.0–46.0)
Hemoglobin: 12.9 g/dL (ref 12.0–15.0)
Potassium: 4.2 mmol/L (ref 3.5–5.1)
Sodium: 140 mmol/L (ref 135–145)
TCO2: 24 mmol/L (ref 22–32)

## 2019-01-24 LAB — CBC
HCT: 37.5 % (ref 36.0–46.0)
Hemoglobin: 12.9 g/dL (ref 12.0–15.0)
MCH: 29.5 pg (ref 26.0–34.0)
MCHC: 34.4 g/dL (ref 30.0–36.0)
MCV: 85.6 fL (ref 80.0–100.0)
Platelets: 89 10*3/uL — ABNORMAL LOW (ref 150–400)
RBC: 4.38 MIL/uL (ref 3.87–5.11)
RDW: 13.8 % (ref 11.5–15.5)
WBC: 7.1 10*3/uL (ref 4.0–10.5)
nRBC: 0 % (ref 0.0–0.2)

## 2019-01-24 MED ORDER — ACD FORMULA A 0.73-2.45-2.2 GM/100ML VI SOLN
500.0000 mL | Status: DC
  Administered 2019-01-24: 08:00:00 500 mL via INTRAVENOUS

## 2019-01-24 MED ORDER — HEPARIN SODIUM (PORCINE) 1000 UNIT/ML IJ SOLN
INTRAMUSCULAR | Status: AC
  Filled 2019-01-24: qty 4

## 2019-01-24 MED ORDER — CALCIUM CARBONATE ANTACID 500 MG PO CHEW
2.0000 | CHEWABLE_TABLET | ORAL | Status: DC
  Administered 2019-01-24: 08:00:00 400 mg via ORAL

## 2019-01-24 MED ORDER — CALCIUM CARBONATE ANTACID 500 MG PO CHEW
CHEWABLE_TABLET | ORAL | Status: AC
  Administered 2019-01-24: 400 mg via ORAL
  Filled 2019-01-24: qty 4

## 2019-01-24 MED ORDER — HEPARIN SODIUM (PORCINE) 1000 UNIT/ML IJ SOLN: 1000.0000 [IU] | Freq: Once | INTRAMUSCULAR | Status: DC

## 2019-01-24 MED ORDER — DIPHENHYDRAMINE HCL 25 MG PO CAPS: 25.0000 mg | ORAL_CAPSULE | Freq: Four times a day (QID) | ORAL | Status: DC | PRN

## 2019-01-24 MED ORDER — CALCIUM GLUCONATE-NACL 2-0.675 GM/100ML-% IV SOLN
2.0000 g | Freq: Once | INTRAVENOUS | Status: AC
  Administered 2019-01-24: 2000 mg via INTRAVENOUS
  Filled 2019-01-24: qty 100

## 2019-01-24 MED ORDER — ACETAMINOPHEN 325 MG PO TABS: 650.0000 mg | ORAL_TABLET | ORAL | Status: DC | PRN

## 2019-01-24 MED ORDER — ACD FORMULA A 0.73-2.45-2.2 GM/100ML VI SOLN
Status: AC
  Administered 2019-01-24: 500 mL via INTRAVENOUS
  Filled 2019-01-24: qty 500

## 2019-01-24 MED ORDER — SODIUM CHLORIDE 0.9 % IV SOLN: 2.0000 g | Freq: Once | INTRAVENOUS | Status: DC

## 2019-01-24 MED ORDER — SODIUM CHLORIDE 0.9 % IV SOLN
INTRAVENOUS | Status: AC
  Administered 2019-01-24 (×2): via INTRAVENOUS_CENTRAL
  Filled 2019-01-24 (×2): qty 200

## 2019-01-24 NOTE — Progress Notes (Signed)
tx completed; pt denies pain, n/v, dizziness, weakness, paresthesia; pt accompanied to the main entrance to her waiting husband.

## 2019-01-29 ENCOUNTER — Encounter (HOSPITAL_COMMUNITY): Payer: Self-pay | Admitting: Physician Assistant

## 2019-01-29 ENCOUNTER — Other Ambulatory Visit: Payer: Self-pay

## 2019-01-29 ENCOUNTER — Ambulatory Visit (HOSPITAL_COMMUNITY)
Admission: RE | Admit: 2019-01-29 | Discharge: 2019-01-29 | Disposition: A | Discharge status: Home / Self Care | Payer: Medicaid Other | Source: Ambulatory Visit | Attending: Student in an Organized Health Care Education/Training Program | Admitting: Student in an Organized Health Care Education/Training Program

## 2019-01-29 ENCOUNTER — Other Ambulatory Visit (HOSPITAL_COMMUNITY): Payer: Self-pay | Admitting: Student in an Organized Health Care Education/Training Program

## 2019-01-29 ENCOUNTER — Ambulatory Visit (HOSPITAL_COMMUNITY): Admit: 2019-01-29 | Payer: Medicaid Other

## 2019-01-29 DIAGNOSIS — H469 Unspecified optic neuritis: Secondary | ICD-10-CM | POA: Insufficient documentation

## 2019-01-29 DIAGNOSIS — Z452 Encounter for adjustment and management of vascular access device: Secondary | ICD-10-CM | POA: Insufficient documentation

## 2019-01-29 HISTORY — PX: IR REMOVAL TUN CV CATH W/O FL: IMG2289

## 2019-01-29 MED ORDER — LIDOCAINE HCL 1 % IJ SOLN
INTRAMUSCULAR | Status: AC
  Filled 2019-01-29: qty 20

## 2019-01-29 MED ORDER — CHLORHEXIDINE GLUCONATE 4 % EX LIQD
CUTANEOUS | Status: AC
  Filled 2019-01-29: qty 15

## 2019-01-29 NOTE — Procedures (Signed)
Pre procedural Dx: Optic neuroitis Post procedural Dx: Same  Successful removal of tunneled central venous catheter.   EBL: None No immediate complications.  Please see imaging section of Epic for full dictation.  Joaquim Nam PA-C 01/29/2019 12:30 PM

## 2019-03-10 IMAGING — XA DG FLUORO GUIDE SPINAL/SI JT INJ*R*
2 series · 2 of 2 positions shown · non-contrast
Comparison: none

CLINICAL DATA: Optic neuritis. Abnormal MRI the brain. Abnormal MRI
of the cervical spine.

[Series 1: ortho standard · 1 of 1 slices shown (1 of 2)]
[im 1/1]
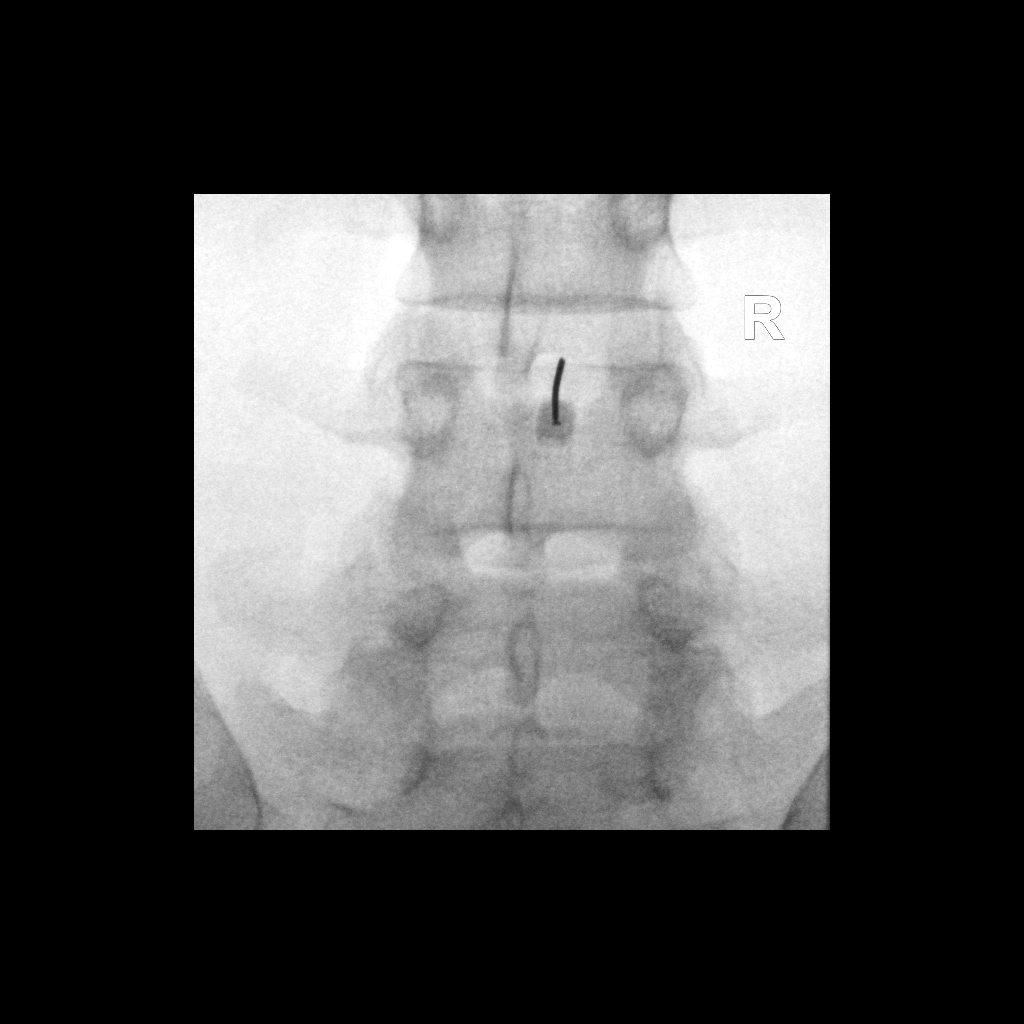

[Series 3: ortho standard · 1 of 1 slices shown (2 of 2)]
[im 1/1]
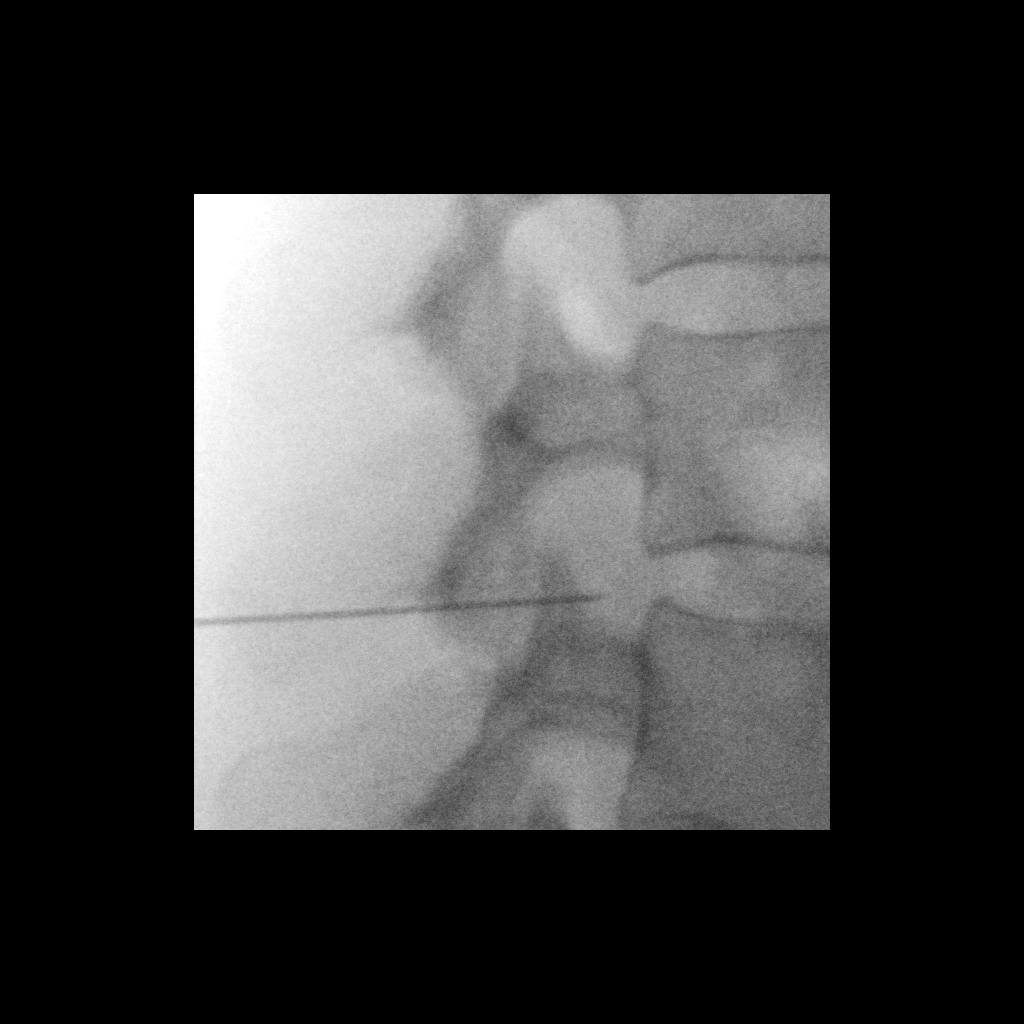

[2 of 2 positions shown; findings below may reference images not displayed]

EXAM:
DIAGNOSTIC LUMBAR PUNCTURE UNDER FLUOROSCOPIC GUIDANCE

FLUOROSCOPY TIME:  Radiation Exposure Index (as provided by the
fluoroscopic device): 14.17 uGy*m2

Fluoroscopy Time:  10 seconds

Number of Acquired Images:  0

PROCEDURE:
Informed consent was obtained from the patient prior to the
procedure, including potential complications of headache, allergy,
and pain. With the patient prone, the lower back was prepped with
Betadine. 1% Lidocaine was used for local anesthesia. Lumbar
puncture was performed at the right paramedian L2-3 level using a 22
gauge needle with return of clear CSF with an opening pressure of
27.0 cm water. 16.0ml of CSF were obtained for laboratory studies.
The closing pressure was 18.5 cm water, upper limits of normal. The
patient tolerated the procedure well and there were no apparent
complications.
IMPRESSION: 1. Elevated opening pressure of 27.0 cm water. This is nonspecific.
It may be related to idiopathic intracranial hypertension. Infection
is considered less likely. Please correlate with results from CSF
testing.
2. Uncomplicated fluoroscopic guided lumbar puncture.

## 2020-07-10 IMAGING — US IR FLUORO GUIDE CV LINE*R*
1 series · 2 of 2 positions shown · non-contrast
Comparison: none

CLINICAL DATA: Acute left optic neuritis and need for
plasmapheresis.

[Series 1: ir fluoro guide cv line*right* · 2 of 2 slices shown]
[im 1/2]
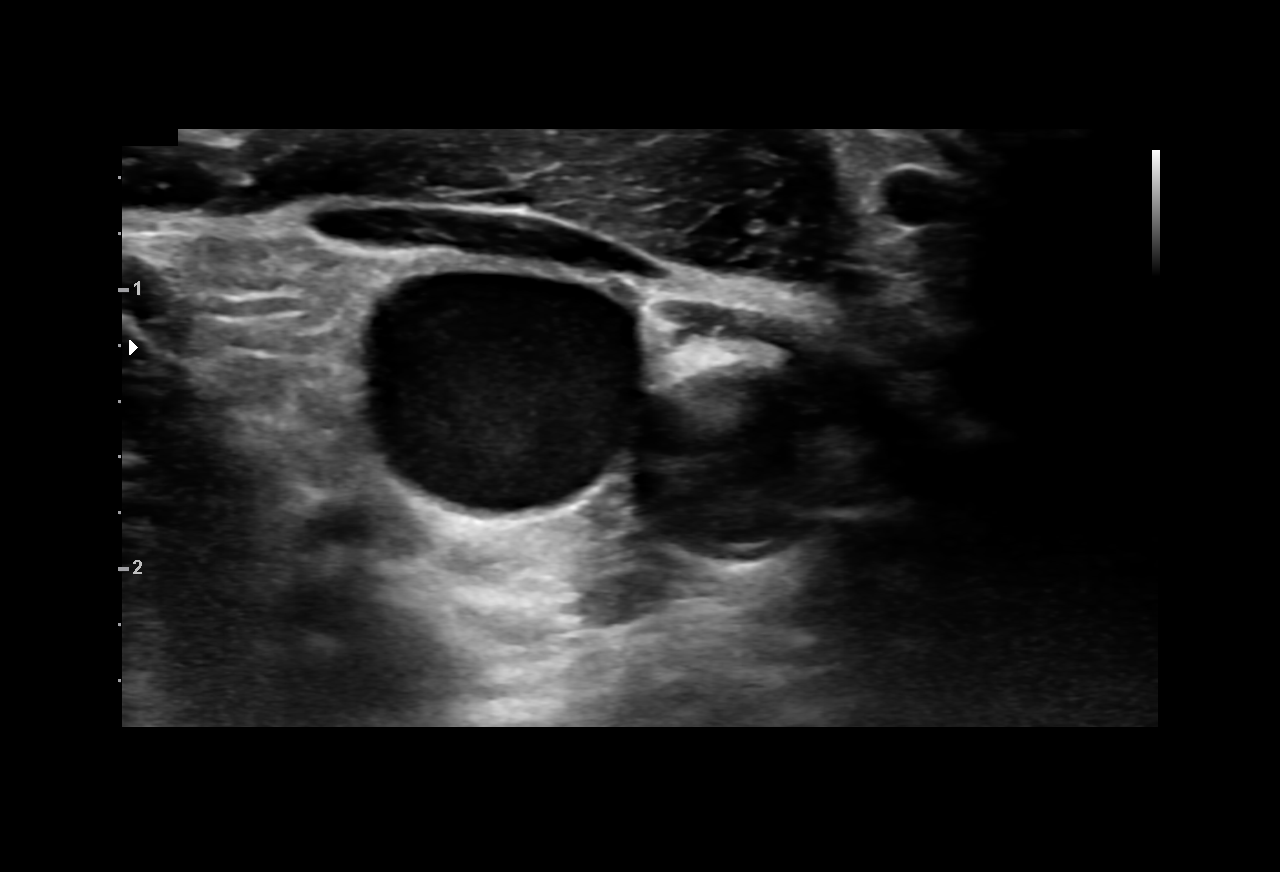
[im 2/2]
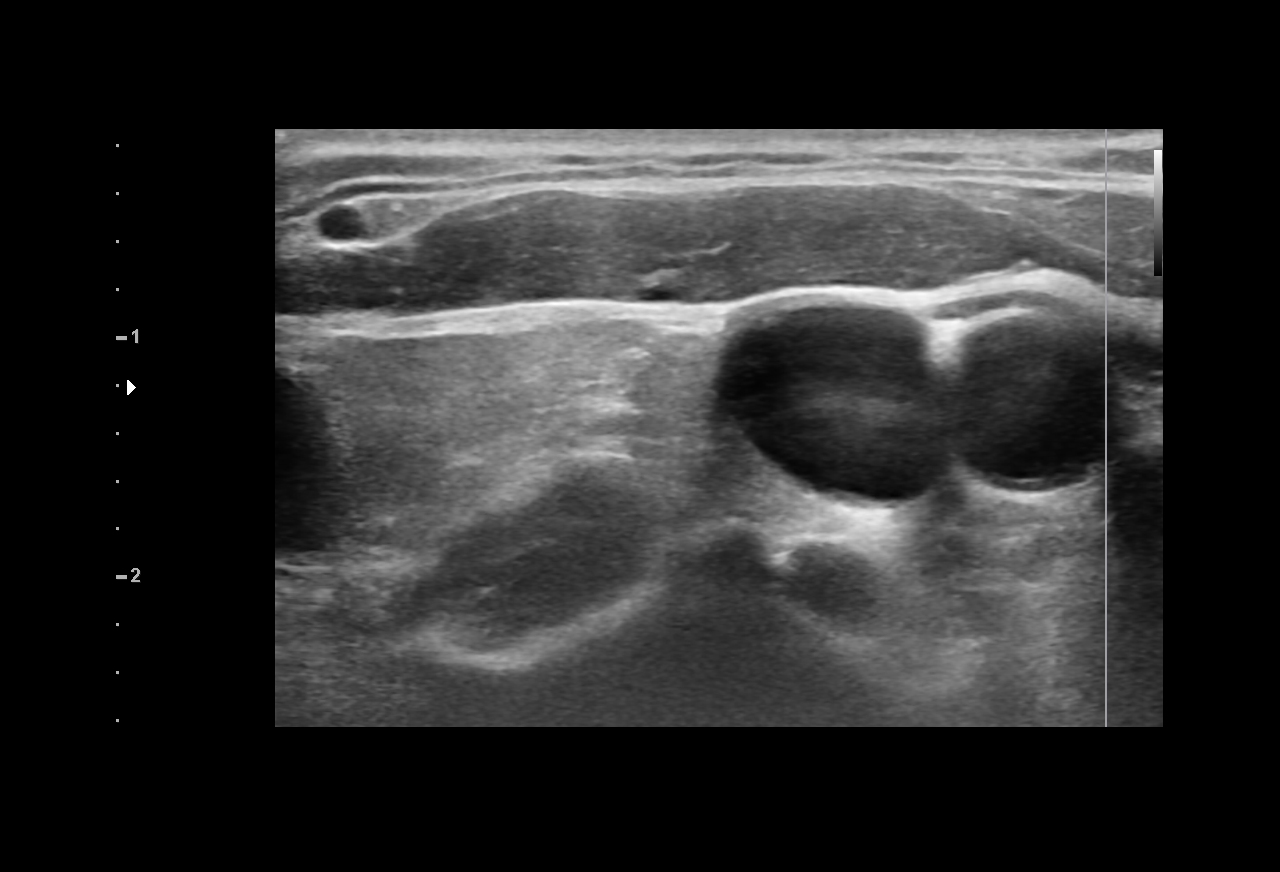

[2 of 2 positions shown; findings below may reference images not displayed]

EXAM:
TUNNELED CENTRAL VENOUS PHERESIS CATHETER PLACEMENT WITH ULTRASOUND
AND FLUOROSCOPIC GUIDANCE

ANESTHESIA/SEDATION:
2.0 mg IV Versed; 75 mcg IV Fentanyl.

Total Moderate Sedation Time:   24 minutes.

The patient's level of consciousness and physiologic status were
continuously monitored during the procedure by Radiology nursing.

MEDICATIONS:
2 g IV Ancef.

FLUOROSCOPY TIME:  1 minutes and 12 seconds.  11.3 mGy.

PROCEDURE:
The procedure, risks, benefits, and alternatives were explained to
the patient. Questions regarding the procedure were encouraged and
answered. The patient understands and consents to the procedure. A
timeout was performed prior to initiating the procedure.

The right neck and chest were prepped with chlorhexidine in a
sterile fashion, and a sterile drape was applied covering the
operative field. Maximum barrier sterile technique with sterile
gowns and gloves were used for the procedure. Local anesthesia was
provided with 1% lidocaine.

Ultrasound was used to confirm patency of the right internal jugular
vein. After creating a small venotomy incision, a 21 gauge needle
was advanced into the right internal jugular vein under direct,
real-time ultrasound guidance. Ultrasound image documentation was
performed. After securing guidewire access, an 8 Fr dilator was
placed. A J-wire was kinked to measure appropriate catheter length.

A palindrome tunneled hemodialysis catheter measuring 19 cm from tip
to cuff was chosen for placement. This was tunneled in a retrograde
fashion from the chest wall to the venotomy incision.

At the venotomy, serial dilatation was performed and a 16 Fr
peel-away sheath was placed over a guidewire. The catheter was then
placed through the sheath and the sheath removed. Final catheter
positioning was confirmed and documented with a fluoroscopic spot
image. The catheter was aspirated, flushed with saline, and injected
with appropriate volume heparin dwells.

The venotomy incision was closed with subcutaneous 4-0 Vicryl.
Dermabond was applied to the incision. The catheter exit site was
secured with 0-Prolene retention sutures.

COMPLICATIONS:
None.  No pneumothorax.
FINDINGS: After catheter placement, the tip lies in the right atrium. The
catheter aspirates normally and is ready for immediate use.
IMPRESSION: Placement of tunneled pheresis catheter via the right internal
jugular vein. The catheter tip lies in the right atrium. The
catheter is ready for immediate use.
